# Patient Record
Sex: Male | Born: 1950
Health system: Southern US, Community
[De-identification: ages and names within clinical notes are randomized; demographics above are authoritative.]

## PROBLEM LIST (undated history)

## (undated) DIAGNOSIS — R0602 Shortness of breath: Secondary | ICD-10-CM

## (undated) DIAGNOSIS — I779 Disorder of arteries and arterioles, unspecified: Secondary | ICD-10-CM

## (undated) DIAGNOSIS — M1990 Unspecified osteoarthritis, unspecified site: Secondary | ICD-10-CM

## (undated) DIAGNOSIS — E785 Hyperlipidemia, unspecified: Secondary | ICD-10-CM

## (undated) DIAGNOSIS — Z951 Presence of aortocoronary bypass graft: Secondary | ICD-10-CM

## (undated) DIAGNOSIS — IMO0002 Reserved for concepts with insufficient information to code with codable children: Secondary | ICD-10-CM

## (undated) DIAGNOSIS — R5383 Other fatigue: Secondary | ICD-10-CM

## (undated) DIAGNOSIS — I10 Essential (primary) hypertension: Secondary | ICD-10-CM

## (undated) DIAGNOSIS — I2510 Atherosclerotic heart disease of native coronary artery without angina pectoris: Secondary | ICD-10-CM

## (undated) DIAGNOSIS — R9430 Abnormal result of cardiovascular function study, unspecified: Secondary | ICD-10-CM

## (undated) DIAGNOSIS — I739 Peripheral vascular disease, unspecified: Principal | ICD-10-CM

## (undated) HISTORY — DX: Other fatigue: R53.83

## (undated) HISTORY — PX: WISDOM TOOTH EXTRACTION: SHX21

## (undated) HISTORY — DX: Hyperlipidemia, unspecified: E78.5

## (undated) HISTORY — DX: Peripheral vascular disease, unspecified: I73.9

## (undated) HISTORY — DX: Shortness of breath: R06.02

## (undated) HISTORY — DX: Reserved for concepts with insufficient information to code with codable children: IMO0002

## (undated) HISTORY — DX: Presence of aortocoronary bypass graft: Z95.1

## (undated) HISTORY — DX: Abnormal result of cardiovascular function study, unspecified: R94.30

## (undated) HISTORY — PX: COLONOSCOPY: SHX174

## (undated) HISTORY — DX: Atherosclerotic heart disease of native coronary artery without angina pectoris: I25.10

## (undated) HISTORY — PX: TONSILLECTOMY: SUR1361

## (undated) HISTORY — PX: MOUTH SURGERY: SHX715

## (undated) HISTORY — DX: Disorder of arteries and arterioles, unspecified: I77.9

---

## 2008-05-17 ENCOUNTER — Emergency Department (HOSPITAL_COMMUNITY): Admission: EM | Admit: 2008-05-17 | Discharge: 2008-05-18 | Payer: Self-pay | Admitting: Emergency Medicine

## 2008-05-19 ENCOUNTER — Emergency Department (HOSPITAL_COMMUNITY): Admission: EM | Admit: 2008-05-19 | Discharge: 2008-05-19 | Payer: Self-pay | Admitting: Emergency Medicine

## 2011-09-20 HISTORY — PX: CORONARY ARTERY BYPASS GRAFT: SHX141

## 2011-10-07 ENCOUNTER — Encounter: Payer: Self-pay | Admitting: Cardiology

## 2011-10-08 ENCOUNTER — Institutional Professional Consult (permissible substitution): Payer: Self-pay | Admitting: Cardiology

## 2011-10-11 ENCOUNTER — Encounter: Payer: Self-pay | Admitting: *Deleted

## 2011-10-11 ENCOUNTER — Ambulatory Visit (INDEPENDENT_AMBULATORY_CARE_PROVIDER_SITE_OTHER): Payer: 59 | Admitting: Cardiology

## 2011-10-11 ENCOUNTER — Encounter: Payer: Self-pay | Admitting: Cardiology

## 2011-10-11 DIAGNOSIS — R0602 Shortness of breath: Secondary | ICD-10-CM

## 2011-10-11 DIAGNOSIS — R079 Chest pain, unspecified: Secondary | ICD-10-CM

## 2011-10-11 DIAGNOSIS — R5383 Other fatigue: Secondary | ICD-10-CM | POA: Insufficient documentation

## 2011-10-11 LAB — CBC WITH DIFFERENTIAL/PLATELET
Basophils Relative: 0.6 % (ref 0.0–3.0)
Eosinophils Relative: 1.3 % (ref 0.0–5.0)
HCT: 43.9 % (ref 39.0–52.0)
Hemoglobin: 15.2 g/dL (ref 13.0–17.0)
Lymphs Abs: 1.8 10*3/uL (ref 0.7–4.0)
Monocytes Relative: 8.9 % (ref 3.0–12.0)
Neutro Abs: 3.6 10*3/uL (ref 1.4–7.7)
RDW: 13.9 % (ref 11.5–14.6)
WBC: 6 10*3/uL (ref 4.5–10.5)

## 2011-10-11 LAB — BASIC METABOLIC PANEL
GFR: 64.6 mL/min (ref >60.00)
Glucose, Bld: 114 mg/dL — ABNORMAL HIGH (ref 70–99)
Potassium: 4.1 mEq/L (ref 3.5–5.1)
Sodium: 140 mEq/L (ref 135–145)

## 2011-10-11 NOTE — Assessment & Plan Note (Signed)
The patient has had a very dramatic change in how he is feeling.  He is not able to complete his activities.  He is having exertional chest discomfort.  He has shortness of breath.  He has marked fatigue.  He had an exercise test in 2011 and is reported as showing no marked abnormality.  I carefully considered whether we should proceed with another exercise test.  However after carefully reviewing his overall situation I believe this will not help [place].  If this study is abnormal he will need cardiac catheterization.  If an exercise test or to be normal we will have to consider a false negative as his symptoms have changed so dramatically.  I recommended that we proceed with an outpatient diagnostic catheterization.  The patient and his wife are in agreement.  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  He prefers the groin.  This will be arranged soon.

## 2011-10-11 NOTE — Patient Instructions (Addendum)
Your physician has requested that you have a cardiac catheterization. Cardiac catheterization is used to diagnose and/or treat various heart conditions. Doctors may recommend this procedure for a number of different reasons. The most common reason is to evaluate chest pain. Chest pain can be a symptom of coronary artery disease (CAD), and cardiac catheterization can show whether plaque is narrowing or blocking your heart's arteries. This procedure is also used to evaluate the valves, as well as measure the blood flow and oxygen levels in different parts of your heart. For further information please visit [email or web address] Please follow instruction sheet, as given. Your physician recommends that you return for lab work in: today.

## 2011-10-11 NOTE — Assessment & Plan Note (Signed)
Patient has had chest x-rays by his primary physician.  I do not have all of the reports but there has been no evidence of pneumonia or congestive heart failure.  I have considered a 2-D echo.  I feel we can bypass this at this point and proceed with catheterization and do a left ventriculogram at the time of his catheterization.  We will do a left and right heart catheter be sure that there is no unusual findings in the right heart.

## 2011-10-11 NOTE — Progress Notes (Signed)
HPI The patient is seen today referred for cardiology consultation.  He is very active.  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  He also exercises at the gym and works in his yard.  Over the past month he has noted marked fatigue with exertion.  He does have some chest discomfort and some shortness of breath.  These are always with exertion.  A change for him has been dramatic.  He has not had syncope or presyncope.  He underwent a nuclear exercise test in 2011 elsewhere.  He reports that he was told that this was normal.  That study was done only as a baseline study.  He was not having a marked symptoms and is having now.  There is a family history of coronary disease.  He does not have other marked risk factors are he.  His TSH has been very slightly elevated being followed.  His testosterone is slightly abnormal.  Other labs are normal.   No Known Allergies  Current Outpatient Prescriptions  Medication Sig Dispense Refill  . aspirin 81 MG tablet Take 81 mg by mouth daily.        . metoprolol (TOPROL-XL) 50 MG 24 hr tablet Take 50 mg by mouth daily.          History   Social History  . Marital Status: Married    Spouse Name: N/A    Number of Children: N/A  . Years of Education: N/A   Occupational History  . Not on file.   Social History Main Topics  . Smoking status: Former Games developer  . Smokeless tobacco: Not on file  . Alcohol Use: Not on file  . Drug Use: Not on file  . Sexually Active: Not on file   Other Topics Concern  . Not on file   Social History Narrative  . No narrative on file    Family History  Problem Relation Age of Onset  . Coronary artery disease    . Hypertension      Past Medical History  Diagnosis Date  . Fatigue   . Chest pain     October, 2012  . Shortness of breath     October, 2012    No past surgical history on file.  ROS  Patient denies fever, chills, headache, sweats, rash, change in vision, change in hearing, cough, nausea vomiting,  urinary symptoms.  All other systems are reviewed and are negative. PHYSICAL EXAM Patient is here with his wife.  He is oriented to person time and place.  Affect is normal.  Head is atraumatic.  There is no xanthelasma.  There is no jugular venous distention.  Lungs are clear.  Respiratory effort is nonlabored.  Cardiac exam reveals S1-S2.  No clicks or significant murmurs.  Abdomen is soft.  There is no peripheral edema.  There are no musculoskeletal deformities.  Skin rashes. Filed Vitals:   10/11/11 0930  BP: 151/83  Pulse: 62  Height: 5\' 7"  (1.702 m)  Weight: 177 lb (80.287 kg)    EKG is done today and reviewed by there is mild sinus bradycardia.  There is no acute change.  ASSESSMENT & PLAN

## 2011-10-12 ENCOUNTER — Inpatient Hospital Stay (HOSPITAL_BASED_OUTPATIENT_CLINIC_OR_DEPARTMENT_OTHER)
Admission: RE | Admit: 2011-10-12 | Discharge: 2011-10-12 | Disposition: A | Discharge status: Home / Self Care | Payer: 59 | Source: Ambulatory Visit | Attending: Cardiovascular Disease | Admitting: Cardiovascular Disease

## 2011-10-12 ENCOUNTER — Inpatient Hospital Stay (HOSPITAL_COMMUNITY): Payer: 59

## 2011-10-12 ENCOUNTER — Inpatient Hospital Stay (HOSPITAL_COMMUNITY)
Admission: AD | Admit: 2011-10-12 | Discharge: 2011-10-17 | DRG: 234 | Disposition: A | Discharge status: Home / Self Care | Payer: 59 | Source: Other Acute Inpatient Hospital | Attending: Thoracic Surgery (Cardiothoracic Vascular Surgery) | Admitting: Thoracic Surgery (Cardiothoracic Vascular Surgery)

## 2011-10-12 DIAGNOSIS — I6529 Occlusion and stenosis of unspecified carotid artery: Secondary | ICD-10-CM | POA: Diagnosis present

## 2011-10-12 DIAGNOSIS — I959 Hypotension, unspecified: Secondary | ICD-10-CM | POA: Diagnosis not present

## 2011-10-12 DIAGNOSIS — R079 Chest pain, unspecified: Secondary | ICD-10-CM | POA: Insufficient documentation

## 2011-10-12 DIAGNOSIS — I2510 Atherosclerotic heart disease of native coronary artery without angina pectoris: Principal | ICD-10-CM | POA: Diagnosis present

## 2011-10-12 DIAGNOSIS — D649 Anemia, unspecified: Secondary | ICD-10-CM | POA: Diagnosis not present

## 2011-10-12 DIAGNOSIS — Z01810 Encounter for preprocedural cardiovascular examination: Secondary | ICD-10-CM

## 2011-10-12 DIAGNOSIS — R110 Nausea: Secondary | ICD-10-CM | POA: Diagnosis not present

## 2011-10-12 DIAGNOSIS — I10 Essential (primary) hypertension: Secondary | ICD-10-CM | POA: Diagnosis present

## 2011-10-12 DIAGNOSIS — R0602 Shortness of breath: Secondary | ICD-10-CM | POA: Insufficient documentation

## 2011-10-12 DIAGNOSIS — Z87891 Personal history of nicotine dependence: Secondary | ICD-10-CM

## 2011-10-12 DIAGNOSIS — Z79899 Other long term (current) drug therapy: Secondary | ICD-10-CM

## 2011-10-12 DIAGNOSIS — D696 Thrombocytopenia, unspecified: Secondary | ICD-10-CM | POA: Diagnosis not present

## 2011-10-12 DIAGNOSIS — Z7982 Long term (current) use of aspirin: Secondary | ICD-10-CM

## 2011-10-12 LAB — URINALYSIS, ROUTINE W REFLEX MICROSCOPIC
Bilirubin Urine: NEGATIVE
Nitrite: NEGATIVE
Protein, ur: NEGATIVE mg/dL
Specific Gravity, Urine: 1.01 (ref 1.005–1.030)
Urobilinogen, UA: 0.2 mg/dL (ref 0.0–1.0)

## 2011-10-12 LAB — POCT I-STAT 3, VENOUS BLOOD GAS (G3P V)
Bicarbonate: 25.9 mEq/L — ABNORMAL HIGH (ref 20.0–24.0)
TCO2: 27 mmol/L (ref 0–100)
pCO2, Ven: 38.4 mmHg — ABNORMAL LOW (ref 45.0–50.0)
pH, Ven: 7.436 — ABNORMAL HIGH (ref 7.250–7.300)
pO2, Ven: 35 mmHg (ref 30.0–45.0)

## 2011-10-12 LAB — COMPREHENSIVE METABOLIC PANEL
ALT: 23 U/L (ref 0–53)
CO2: 27 mEq/L (ref 19–32)
Calcium: 9.3 mg/dL (ref 8.4–10.5)
Chloride: 104 mEq/L (ref 96–112)
Creatinine, Ser: 1.13 mg/dL (ref 0.50–1.35)
GFR calc Af Amer: 80 mL/min — ABNORMAL LOW (ref >90)
GFR calc non Af Amer: 69 mL/min — ABNORMAL LOW (ref >90)
Glucose, Bld: 85 mg/dL (ref 70–99)
Sodium: 141 mEq/L (ref 135–145)
Total Bilirubin: 0.9 mg/dL (ref 0.3–1.2)

## 2011-10-12 LAB — PROTIME-INR: INR: 1.03 (ref 0.00–1.49)

## 2011-10-12 LAB — POCT I-STAT 3, ART BLOOD GAS (G3+)
Acid-Base Excess: 1 mmol/L (ref 0.0–2.0)
Bicarbonate: 25.6 mEq/L — ABNORMAL HIGH (ref 20.0–24.0)
O2 Saturation: 97 %
pO2, Arterial: 92 mmHg (ref 80.0–100.0)

## 2011-10-12 LAB — CBC
Platelets: 140 10*3/uL — ABNORMAL LOW (ref 150–400)
RDW: 13.2 % (ref 11.5–15.5)
WBC: 8.3 10*3/uL (ref 4.0–10.5)

## 2011-10-12 LAB — APTT: aPTT: 31 seconds (ref 24–37)

## 2011-10-12 LAB — SURGICAL PCR SCREEN
MRSA, PCR: NEGATIVE
Staphylococcus aureus: NEGATIVE

## 2011-10-13 ENCOUNTER — Encounter (HOSPITAL_COMMUNITY): Payer: 59

## 2011-10-13 ENCOUNTER — Inpatient Hospital Stay (HOSPITAL_COMMUNITY): Payer: 59

## 2011-10-13 DIAGNOSIS — I2510 Atherosclerotic heart disease of native coronary artery without angina pectoris: Secondary | ICD-10-CM

## 2011-10-13 LAB — POCT I-STAT, CHEM 8
Creatinine, Ser: 1.2 mg/dL (ref 0.50–1.35)
Hemoglobin: 10.9 g/dL — ABNORMAL LOW (ref 13.0–17.0)
Potassium: 4.4 mEq/L (ref 3.5–5.1)
Sodium: 139 mEq/L (ref 135–145)

## 2011-10-13 LAB — LIPID PANEL: Cholesterol: 231 mg/dL — ABNORMAL HIGH (ref 0–200)

## 2011-10-13 LAB — TSH: TSH: 6.949 u[IU]/mL — ABNORMAL HIGH (ref 0.350–4.500)

## 2011-10-13 LAB — CREATININE, SERUM
Creatinine, Ser: 1.17 mg/dL (ref 0.50–1.35)
GFR calc Af Amer: 77 mL/min — ABNORMAL LOW (ref >90)
GFR calc non Af Amer: 67 mL/min — ABNORMAL LOW (ref >90)

## 2011-10-13 LAB — BASIC METABOLIC PANEL
BUN: 17 mg/dL (ref 6–23)
Calcium: 9.1 mg/dL (ref 8.4–10.5)
Chloride: 106 mEq/L (ref 96–112)
Creatinine, Ser: 1.14 mg/dL (ref 0.50–1.35)
GFR calc Af Amer: 80 mL/min — ABNORMAL LOW (ref >90)
GFR calc non Af Amer: 69 mL/min — ABNORMAL LOW (ref >90)

## 2011-10-13 LAB — CBC
HCT: 42.8 % (ref 39.0–52.0)
Hemoglobin: 11.3 g/dL — ABNORMAL LOW (ref 13.0–17.0)
Hemoglobin: 12.5 g/dL — ABNORMAL LOW (ref 13.0–17.0)
MCH: 31.9 pg (ref 26.0–34.0)
MCH: 31.7 pg (ref 26.0–34.0)
MCH: 32.3 pg (ref 26.0–34.0)
MCHC: 35.5 g/dL (ref 30.0–36.0)
MCV: 89.9 fL (ref 78.0–100.0)
MCV: 89.3 fL (ref 78.0–100.0)
Platelets: 131 10*3/uL — ABNORMAL LOW (ref 150–400)
RBC: 3.56 MIL/uL — ABNORMAL LOW (ref 4.22–5.81)
RBC: 3.87 MIL/uL — ABNORMAL LOW (ref 4.22–5.81)
RDW: 13.2 % (ref 11.5–15.5)
WBC: 7.5 10*3/uL (ref 4.0–10.5)
WBC: 14.3 10*3/uL — ABNORMAL HIGH (ref 4.0–10.5)

## 2011-10-13 LAB — POCT I-STAT 3, ART BLOOD GAS (G3+)
Bicarbonate: 20.9 mEq/L (ref 20.0–24.0)
O2 Saturation: 100 %
O2 Saturation: 93 %
Patient temperature: 35.7
TCO2: 25 mmol/L (ref 0–100)
TCO2: 22 mmol/L (ref 0–100)
pCO2 arterial: 41.5 mmHg (ref 35.0–45.0)
pCO2 arterial: 36.6 mmHg (ref 35.0–45.0)
pCO2 arterial: 30.2 mmHg — ABNORMAL LOW (ref 35.0–45.0)
pH, Arterial: 7.447 (ref 7.350–7.450)
pO2, Arterial: 286 mmHg — ABNORMAL HIGH (ref 80.0–100.0)

## 2011-10-13 LAB — POCT I-STAT 4, (NA,K, GLUC, HGB,HCT)
Glucose, Bld: 106 mg/dL — ABNORMAL HIGH (ref 70–99)
Glucose, Bld: 87 mg/dL (ref 70–99)
Glucose, Bld: 93 mg/dL (ref 70–99)
HCT: 41 % (ref 39.0–52.0)
HCT: 27 % — ABNORMAL LOW (ref 39.0–52.0)
HCT: 33 % — ABNORMAL LOW (ref 39.0–52.0)
Hemoglobin: 10.2 g/dL — ABNORMAL LOW (ref 13.0–17.0)
Hemoglobin: 13.9 g/dL (ref 13.0–17.0)
Hemoglobin: 9.2 g/dL — ABNORMAL LOW (ref 13.0–17.0)
Potassium: 4.3 mEq/L (ref 3.5–5.1)
Potassium: 3.7 mEq/L (ref 3.5–5.1)
Potassium: 3.9 mEq/L (ref 3.5–5.1)
Sodium: 139 mEq/L (ref 135–145)
Sodium: 136 mEq/L (ref 135–145)
Sodium: 138 mEq/L (ref 135–145)

## 2011-10-13 LAB — HEMOGLOBIN AND HEMATOCRIT, BLOOD
HCT: 28.8 % — ABNORMAL LOW (ref 39.0–52.0)
Hemoglobin: 10.2 g/dL — ABNORMAL LOW (ref 13.0–17.0)

## 2011-10-13 LAB — PROTIME-INR: Prothrombin Time: 18.5 seconds — ABNORMAL HIGH (ref 11.6–15.2)

## 2011-10-13 LAB — MAGNESIUM: Magnesium: 2.7 mg/dL — ABNORMAL HIGH (ref 1.5–2.5)

## 2011-10-13 LAB — PLATELET COUNT: Platelets: 111 10*3/uL — ABNORMAL LOW (ref 150–400)

## 2011-10-13 NOTE — Consult Note (Signed)
NAMEROYE, GUSTAFSON NO.:  [id number]  MEDICAL RECORD NO.:  [id number]  LOCATION:  2917                         FACILITY:  [facility]  PHYSICIAN:  Salvatore Decent. Dorris Fetch, M.D.DATE OF BIRTH:  1951/07/01  DATE OF CONSULTATION:  10/12/2011 DATE OF DISCHARGE:                                CONSULTATION   REASON FOR CONSULTATION:  Left main and three-vessel disease.  HISTORY OF PRESENT ILLNESS:  Mr. Brawn is a 60 year old gentleman with a strong family history of coronary artery disease and hypertension who presented with a chief complaint of exertional fatigue and chest discomfort, also had some degree of shortness of breath.  He had noticed real deterioration since Labor Day with his workouts.  He exercises on a regular basis and he knows that at the gym his workouts are became progressively shorter and more difficult.  He would also notice when doing yard work that he would have to stop frequently because of fatigue and discomfort.  He apparently had had a nuclear stress test in 2011 that was normal but was not having significant symptoms at that time. He was seen in consultation by Dr. Willa Rough and was recommended that he have cardiac catheterization that was done today and he is found to have critical left main and three-vessel coronary artery disease with 95% left main stenosis and 90% ostial and mid lesions in the LAD, 95% ostial circumflex stenosis, and a 99% heavily calcified mid right coronary lesion.  He had normal left ventricular function.  The patient currently is pain-free.  He does note that he has had several episodes usually about 9 or 10 o'clock in the evening where he feels very poorly for about 5 or 10 minutes and then resolve spontaneously.  He has not been awakened from sleep by his symptoms.  PAST MEDICAL HISTORY:  Significant for hypertension.  CURRENT MEDICATIONS:  Aspirin 81 mg daily and Toprol-XL 50 mg daily.  No known drug  allergies.  FAMILY HISTORY:  Significant for father dying at age 59 of MI.  No coronary artery disease in his mother and family history of hypertension and strokes as well.  SOCIAL HISTORY:  Former smoker.  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  REVIEW OF SYSTEMS:  No orthopnea, paroxysmal nocturnal dyspnea, peripheral edema.  No change in bowel or bladder habits.  No history of excessive bleeding or bruising.  He does note that he has been more tired than normal recently and fatigues easily.  All other systems are negative.  PHYSICAL EXAMINATION:  GENERAL:  Mr. Lichty is a well-appearing 60 year old white male, in no acute distress.  He is well developed and well nourished. NEUROLOGIC:  He is alert and oriented x3 with no focal deficits. HEENT:  Unremarkable. NECK:  Without thyromegaly, adenopathy, or bruits. CARDIAC:  Regular rate and rhythm.  Normal S1 and S2.  No rubs, murmurs, or gallops. LUNGS:  Clear breath sounds bilaterally.  EXTREMITIES:  Without clubbing, cyanosis, or edema.  He has 2+ pulses throughout. LABORATORY DATA:  Cardiac catheterization films were reviewed.  His sodium is 140, potassium 4.1, BUN 21, creatinine  1.2, and glucose 114. White count 6, hematocrit 44, and platelets 137.  PT 11.2 and PTT 25.8. EKG showed sinus bradycardia.  IMPRESSION:  Mr. Stthomas is a 60 year old gentleman who presents with progressive angina and catheterization.  He has critical left main and three-vessel coronary artery disease with preserved left ventricular function.  Coronary artery bypass grafting is indicated for survival benefit as well as relief of symptoms.  I discussed in detail with him the indications, risks, benefits, and alternatives.  He understands the general nature of the procedure, need for general anesthesia, expected hospital stay, and overall recovery. He does understand that the risks include, but are not limited to  death, stroke, MI, DVT, PE, bleeding, possible need for transfusions, infection as well as other organ system dysfunction including respiratory, renal, hepatic, or GI complications.  He understands and accepts these risks and agrees to proceed.  We will plan to proceed with coronary artery bypass grafting first case tomorrow morning.  I did speak with the wife by telephone and she is not present here currently.     Salvatore Decent Dorris Fetch, M.D.     SCH/MEDQ  D:  10/12/2011  T:  10/13/2011  Job:  [id number]  cc:   Luis Abed, MD, [facility] Pearline Cables, MD  Electronically Signed by Charlett Lango M.D. on 10/13/2011 04:41:41 PM

## 2011-10-14 ENCOUNTER — Inpatient Hospital Stay (HOSPITAL_COMMUNITY): Payer: 59

## 2011-10-14 LAB — GLUCOSE, CAPILLARY
Glucose-Capillary: 131 mg/dL — ABNORMAL HIGH (ref 70–99)
Glucose-Capillary: 134 mg/dL — ABNORMAL HIGH (ref 70–99)
Glucose-Capillary: 150 mg/dL — ABNORMAL HIGH (ref 70–99)
Glucose-Capillary: 78 mg/dL (ref 70–99)
Glucose-Capillary: 122 mg/dL — ABNORMAL HIGH (ref 70–99)
Glucose-Capillary: 131 mg/dL — ABNORMAL HIGH (ref 70–99)
Glucose-Capillary: 116 mg/dL — ABNORMAL HIGH (ref 70–99)

## 2011-10-14 LAB — BASIC METABOLIC PANEL
Chloride: 105 mEq/L (ref 96–112)
GFR calc Af Amer: >90 mL/min (ref >90)
GFR calc non Af Amer: 79 mL/min — ABNORMAL LOW (ref >90)
Potassium: 4.2 mEq/L (ref 3.5–5.1)
Sodium: 137 mEq/L (ref 135–145)

## 2011-10-14 LAB — POCT I-STAT, CHEM 8
Calcium, Ion: 1.29 mmol/L (ref 1.12–1.32)
Chloride: 101 mEq/L (ref 96–112)
HCT: 30 % — ABNORMAL LOW (ref 39.0–52.0)
Hemoglobin: 10.2 g/dL — ABNORMAL LOW (ref 13.0–17.0)
TCO2: 25 mmol/L (ref 0–100)

## 2011-10-14 LAB — CREATININE, SERUM
Creatinine, Ser: 1.17 mg/dL (ref 0.50–1.35)
GFR calc non Af Amer: 67 mL/min — ABNORMAL LOW (ref >90)

## 2011-10-14 LAB — CBC
HCT: 29.8 % — ABNORMAL LOW (ref 39.0–52.0)
MCH: 31.1 pg (ref 26.0–34.0)
MCHC: 34.6 g/dL (ref 30.0–36.0)
Platelets: 105 10*3/uL — ABNORMAL LOW (ref 150–400)
RDW: 12.9 % (ref 11.5–15.5)
RDW: 13.4 % (ref 11.5–15.5)
WBC: 11.2 10*3/uL — ABNORMAL HIGH (ref 4.0–10.5)

## 2011-10-14 LAB — MAGNESIUM: Magnesium: 1.9 mg/dL (ref 1.5–2.5)

## 2011-10-15 ENCOUNTER — Inpatient Hospital Stay (HOSPITAL_COMMUNITY): Payer: 59

## 2011-10-15 LAB — CBC
MCH: 31.6 pg (ref 26.0–34.0)
MCHC: 34.9 g/dL (ref 30.0–36.0)
MCV: 90.7 fL (ref 78.0–100.0)
Platelets: 84 10*3/uL — ABNORMAL LOW (ref 150–400)
RDW: 13.5 % (ref 11.5–15.5)
WBC: 9.7 10*3/uL (ref 4.0–10.5)

## 2011-10-15 LAB — BASIC METABOLIC PANEL
Calcium: 8.9 mg/dL (ref 8.4–10.5)
Creatinine, Ser: 1.19 mg/dL (ref 0.50–1.35)
GFR calc Af Amer: 76 mL/min — ABNORMAL LOW (ref >90)
GFR calc non Af Amer: 65 mL/min — ABNORMAL LOW (ref >90)

## 2011-10-15 LAB — GLUCOSE, CAPILLARY: Glucose-Capillary: 130 mg/dL — ABNORMAL HIGH (ref 70–99)

## 2011-10-15 NOTE — Op Note (Signed)
NAMEBROCK, LARMON NO.:  [id number]  MEDICAL RECORD NO.:  [id number]  LOCATION:  2316                         FACILITY:  [facility]  PHYSICIAN:  Salvatore Decent. Dorris Fetch, M.D.DATE OF BIRTH:  [birth year]  DATE OF PROCEDURE: DATE OF DISCHARGE:                              OPERATIVE REPORT   PREOPERATIVE DIAGNOSIS:  Critical left main and three-vessel disease with new-onset angina.  POSTOPERATIVE DIAGNOSIS:  Critical left main and three-vessel disease with new-onset angina.  PROCEDURES:  Median sternotomy, extracorporeal circulation, coronary artery bypass grafting x3 (left internal mammary artery to LAD, right internal mammary artery to obtuse marginal 1, and saphenous vein graft to posterior descending).  SURGEON:  Salvatore Decent. Dorris Fetch, MD  ASSISTANT:  Rowe Clack, PA-C  ANESTHESIA:  General.  FINDINGS:  Severe, diffusely diseased coronaries proximally, distal right and proximal posterior descending heavily calcified, all vessels good quality at the site of the anastomosis, good quality conduits.  CLINICAL NOTE:  Mr. Brunty is a 60 year old gentleman with a strong family history of coronary disease.  He presents with about a 2-3-week history of progressive fatigue, exertional chest discomfort, and dyspnea.  He was seen by Dr. Willa Rough and cardiac catheterization was recommended.  There, he was found to have a 95% left main stenosis and a 99% stenosis in the right coronary.  All coronaries were heavily calcified proximally.  The patient was advised to undergo coronary artery bypass grafting.  The indications, risks, benefits, and alternatives were discussed in detail with the patient.  He understood and accepted the risks and agreed to proceed.  He understands there was excellent chance of a good outcome.  He understood the risks included but are not limited to death, stroke, MI, DVT, PE, bleeding, possible need for transfusions, infections as well as  other organ system dysfunction including respiratory failure and renal failure.  OPERATIVE NOTE:  Mr. Chamberlin was brought to the preop holding area on October 13, 2011.  There, the Anesthesia Service placed lines to monitor arterial, central venous, and pulmonary arterial pressures.  Intravenous antibiotics were administered.  He was taken to the operating room, anesthetized, and intubated.  A Foley catheter was placed.  The chest, abdomen, and legs were prepped and draped in usual fashion.  Median sternotomy was performed, and the left internal mammary artery was harvested using standard technique.  Simultaneously, an incision was made in the medial aspect of the right leg and the greater saphenous vein was harvested from the right thigh endoscopically.  2000 units of heparin was administered during the vessel harvest.  After taking down the left internal mammary artery, which was a good quality vessel, the right internal mammary artery was harvested in standard fashion as well. It likewise was a good quality.  Remainder of the full heparin dose was given.  The pericardium was opened after confirming adequate anticoagulation with ACT measurement. The aorta was cannulated via concentric 2-0 Ethibond pledgeted pursestring sutures.  A dual-stage venous cannula was placed via pursestring suture in the right atrial appendage.  Cardiopulmonary bypass was instituted and the patient was cooled to 32 degrees Celsius. The coronary arteries were inspected and anastomotic sites were  chosen. The conduits were inspected and cut to length.  It was noted that because the graft had to be placed out on to the posterior descending, the right mammary was not of adequate length to reach that area as a pedicle graft, however, did reach to the obtuse marginal 1 easily via the transverse sinus.  The further decision was made to use the vein graft to the posterior descending, which was the smaller of the  2 targets.  A foam pad was placed in the pericardium to insulate the heart and protect the left phrenic nerve.  A temperature probe was placed in the myocardial septum and a cardioplegic cannula was placed in the ascending aorta.  The aorta was crossclamped.  The left ventricle was emptied via the aortic root vent.  Cardiac arrest was achieved with a combination of cold antegrade blood cardioplegia and topical iced saline.  750 mL of cardioplegia was administered.  There was rapid diastolic arrest and septal cooling to 10 degrees Celsius.  The following distal anastomoses were performed.  First, a reverse saphenous vein graft was placed end-to-side to the posterior descending branch of the right coronary.  This was a 1.5-mm target.  It was heavily calcified in its proximal portion, but a 1.5-mm probe did pass retrograde back into the right coronary.  The vein was anastomosed end-to-side with a running 7-0 Prolene suture.  All anastomoses were probed proximally and distally at their completion prior to tying the suture.  Cardioplegia was administered down the vein graft at the completion of the anastomosis and there was good flow and good hemostasis.  Next, the right internal mammary artery was brought through the transverse sinus.  Care was taken not to twist the pedicle and the distal end was beveled.  It was then anastomosed end-to-side to obtuse marginal 1, which was the only graftable lateral wall vessel.  It was heavily calcified proximally, but good quality at the site of the anastomosis.  It was a 1.5-mm target vessel.  The mammary to OM1 anastomosis was performed with a running 8-0 Prolene suture.  Again, a probe passed easily proximally and distally at the completion of the anastomosis.  The bulldog clamps briefly removed to inspect for hemostasis and then replaced.  The mammary pedicle was tacked to the epicardial surface of the heart with 6-0 Prolene sutures.  Additional  cardioplegia was administered down the aortic root.  The left internal mammary artery then was brought through a window in the pericardium.  The distal end was beveled.  It was anastomosed end-to- side to the distal LAD.  The LAD had significant calcific plaque throughout its course.  However, 1.5-mm probe did pass for a long distance proximally past the takeoff of the first diagonal and also passed distally to the apex.  The mammary was of good quality vessel was anastomosed end-to-side with a running 8-0 Prolene suture.  At the completion of the mammary to LAD anastomosis, the bulldog clamp was briefly removed to inspect for hemostasis.  Immediate rapid septal rewarming was noted.  The bulldog clamp was replaced.  The mammary pedicle was tacked to the epicardial surface of the heart with 6-0 Prolene sutures.  The vein graft was cut to length.  The cardioplegic cannula was removed from the ascending aorta and the proximal vein graft anastomosis was performed to a 4.5 mm punch aortotomy with a running 6-0 Prolene suture. At the completion of the proximal anastomosis, the patient was placed in Trendelenburg position.  Lidocaine was  administered.  The bulldog clamps removed from the mammary arteries.  The aortic root was de-aired and the aortic crossclamp was removed.  The patient was defibrillated and required single defibrillation with 20 joules and then was in sinus rhythm thereafter.  While rewarming was completed, all proximal and distal anastomoses were inspected for hemostasis.  Epicardial pacing wires were placed on the right ventricle and right atrium.  When the patient had rewarmed to a core temperature of 37 degrees Celsius, he was weaned from cardiopulmonary bypass on the first attempt.  The initial cardiac index was greater than 2 L/min/m2 after weaning from bypass.  The patient remained hemodynamically stable throughout the postbypass period.  Test dose of protamine was  administered and was well tolerated.  The atrial and aortic cannulae were removed.  The remainder of the protamine was administered without incident.  The chest irrigated with warm saline.  The pericardium was reapproximated over the heart with interrupted 3-0 silk sutures.  There was an incision on the right side of the pericardium to allow passage of the right mammary artery through the transverse sinus, which precluded closing the pericardium over the base of the aorta.  Bilateral pleural tubes and a single mediastinal chest tube were placed in a separate subcostal incisions and secured with #1 silk sutures.  The sternum was closed with interrupted heavy gauge stainless steel wires.  The pectoralis fascia, subcutaneous tissue, and skin were closed in standard fashion.  All sponge, needle, and instrument counts were correct at the end of the procedure.  The patient was taken from the operating room to the surgical intensive care unit in good condition.     Salvatore Decent Dorris Fetch, M.D.     SCH/MEDQ  D:  10/13/2011  T:  10/13/2011  Job:  [id number]  cc:   Luis Abed, MD, [facility] Pearline Cables, MD  Electronically Signed by Charlett Lango M.D. on 10/15/2011 11:37:08 AM

## 2011-10-16 ENCOUNTER — Inpatient Hospital Stay (HOSPITAL_COMMUNITY): Payer: 59

## 2011-10-16 DIAGNOSIS — I2510 Atherosclerotic heart disease of native coronary artery without angina pectoris: Secondary | ICD-10-CM

## 2011-10-16 LAB — BASIC METABOLIC PANEL
BUN: 15 mg/dL (ref 6–23)
Chloride: 105 mEq/L (ref 96–112)
GFR calc Af Amer: 76 mL/min — ABNORMAL LOW (ref >90)
Glucose, Bld: 95 mg/dL (ref 70–99)
Potassium: 3.8 mEq/L (ref 3.5–5.1)
Sodium: 142 mEq/L (ref 135–145)

## 2011-10-16 LAB — CBC
HCT: 31.1 % — ABNORMAL LOW (ref 39.0–52.0)
Hemoglobin: 10.5 g/dL — ABNORMAL LOW (ref 13.0–17.0)
WBC: 9 10*3/uL (ref 4.0–10.5)

## 2011-10-17 NOTE — Cardiovascular Report (Signed)
NAMEGILMAR, BUA NO.:  [id number]  MEDICAL RECORD NO.:  [id number]  LOCATION:  2917                         FACILITY:  [facility]  PHYSICIAN:  Veverly Fells. Excell Seltzer, MD  DATE OF BIRTH:  02-Apr-1951  DATE OF PROCEDURE:  10/12/2011 DATE OF DISCHARGE:                           CARDIAC CATHETERIZATION   PROCEDURE: 1. Right heart catheterization. 2. Left heart catheterization. 3. Selective coronary angiography. 4. Left ventricular angiography.  PROCEDURAL INDICATION:  Mr. Mundis is a 60 year old gentleman who has been very active.  He has developed shortness of breath, chest discomfort, and marked exertional fatigue.  He had a nuclear stress test in 2011, that showed no significant abnormality.  Because of his progressive symptoms he was referred for right and left heart catheterization for definitive evaluation.  Risks and indications of the procedure were reviewed with the patient in detail.  Informed consent was obtained.  The right groin was prepped, draped and anesthetized with 1% lidocaine.  Using modified Seldinger technique, a 4-French sheath was placed in the right femoral artery and a 6-French sheath was placed in the right femoral vein.  A multipurpose catheter was used for the right heart catheterization.  Standard Judkins catheters were used for the left heart catheterization and coronary angiography.  All catheter exchanges were performed over guidewire, and the patient tolerated the procedure well, and there were no immediate complications.  PROCEDURAL FINDINGS:  Hemodynamics:  Aortic oxygen saturation 97%. Pulmonary artery saturation 70%.  Cardiac output 4.3 L/minute.  Cardiac index is 2.2 L/min/m2.  Invasive pressures:  Right atrial pressure mean of 5, right ventricular pressure 27/9, pulmonary artery pressure 24/10, with a mean of 16, pulmonary capillary wedge pressure is 11.  Aortic pressure is 126/83, with a mean of 101, left ventricular  pressure is 127/16.  Left ventriculography:  This shows normal LV function.  The ejection fraction is estimated at 65%.  There are no regional wall motion abnormalities appreciated.  CORONARY ANGIOGRAPHY:  The left mainstem:  There is severe calcification of the distal left mainstem.  The distal left main has 95% stenosis involving both the ostia of the LAD and the left circumflex.  LAD:  The LAD has critical ostial stenosis of 95-99%.  The vessel has TIMI-3 flow.  There is an 80% lesion further down in the proximal LAD just before the first septal perforator.  The mid and distal vessel have no high-grade obstructive disease after the first diagonal.  The diagonal branches show a very small first diagonal and a moderate to large second diagonal that has 3 subbranches.  The second diagonal has no significant obstruction.  Left circumflex:  The left circumflex has severe ostial stenosis.  There is a 95% ostial lesion with heavy calcification.  The first and second OM branches both appear small.  The first OM has a 90% stenosis.  The second OM has no significant stenosis but again it is very small.  Right coronary artery:  The right coronary artery has critical mid stenosis.  The entire vessel is heavily calcified.  The proximal RCA has 70% stenosis.  The mid RCA has 95-99% stenosis with associated heavy calcification.  The distal  RCA has diffuse disease.  The PDA and posterolateral branches are small in caliber.  The PDA branch fills both antegrade and retrograde from left-to-right collaterals.  FINAL ASSESSMENT: 1. Severe left mainstem and three-vessel coronary artery disease. 2. Normal left ventricular systolic function 3. Normal intracardiac filling pressures.  RECOMMENDATIONS:  The patient will be admitted to the hospital.  He will undergo evaluation by [facility] for consideration of coronary bypass surgery.  If his groin site remains stable, he will  be started on heparin in about 8 hours.     Veverly Fells. Excell Seltzer, MD     MDC/MEDQ  D:  10/12/2011  T:  10/13/2011  Job:  [id number]  cc:   Luis Abed, MD, [facility] Pearline Cables, MD  Electronically Signed by Tonny Bollman MD on 10/17/2011 10:08:06 PM

## 2011-10-18 LAB — HEPARIN INDUCED THROMBOCYTOPENIA PNL
Patient O.D.: 0.143
UFH High Dose UFH H: 0 % Release
UFH Low Dose 0.1 IU/mL: 0 % Release
UFH SRA Result: NEGATIVE

## 2011-10-19 ENCOUNTER — Telehealth: Payer: Self-pay | Admitting: *Deleted

## 2011-10-19 DIAGNOSIS — R110 Nausea: Secondary | ICD-10-CM

## 2011-10-19 MED ORDER — PROMETHAZINE HCL 12.5 MG PO TABS: 25.0000 mg | ORAL_TABLET | Freq: Four times a day (QID) | ORAL | Status: AC | PRN

## 2011-10-19 NOTE — Telephone Encounter (Signed)
I notified Dr. Dorris Fetch of Scott Marshall nausea, dry heaves and lo grade temps.  I reviewed all systems with Scott Marshall and these were negative as to the cause of his fevers and relayed these to Dr. Dorris Fetch.  He recommended Phenergan 25mg , one tab, q6hrs prn.  But, if Scott Marshall felt terribly bad that he could go to the hospital or we could see him at the office.  This info was relayed to him.  He felt as if he could get the nausea under control and get some sleep he would feel much better.  Reassured.

## 2011-10-20 ENCOUNTER — Encounter: Payer: Self-pay | Admitting: *Deleted

## 2011-10-21 NOTE — Discharge Summary (Signed)
NAMEANTION, Scott NO.:  [id number]  MEDICAL RECORD NO.:  [id number]  LOCATION:  2008                         FACILITY:  [facility]  PHYSICIAN:  Salvatore Decent. Dorris Fetch, M.D.DATE OF BIRTH:  04/02/1951  DATE OF ADMISSION:  10/12/2011 DATE OF DISCHARGE:  10/17/2011                              DISCHARGE SUMMARY   PRIMARY ADMITTING DIAGNOSIS:  Chest pain.  ADDITIONAL/DISCHARGE DIAGNOSES: 1. Severe left main and three-vessel coronary artery disease. 2. Hypertension. 3. Remote history of tobacco abuse. 4. Right 60-79% internal carotid artery stenosis by duplex.  PROCEDURES PERFORMED: 1. Cardiac catheterizations. 2. Coronary artery bypass grafting x3 (left internal mammary artery to     the LAD, right internal mammary artery to the first obtuse     marginal, saphenous vein graft to the posterior descending). 3. Endoscopic vein harvest, right leg.  HISTORY:  The patient is a 60 year old male who presented with a 2-3 week history of exertional fatigue and chest discomfort with associated shortness of breath.  He has noted since the beginning of September that he has had more difficulty with shortness of breath and fatigue with exercise.  He was seen in consultation by Dr. Willa Rough and it was recommended that he undergo cardiac catheterizations.  He was brought in to [facility] on the date of this admission and catheterizations showed critical left main and severe three-vessel coronary artery disease with 95% left main stenosis, a 90% ostial and mid LAD lesion, 95% ostial circumflex stenosis, and a 99% calcified mid right coronary artery lesion.  Left ventricular ejection fraction was within normal limits.  Because of these findings, the patient was subsequently admitted for further workup.  HOSPITAL COURSE:  Scott Marshall was admitted and was seen in consultation by Dr. Charlett Lango for cardiac surgery.  It was felt that because of his severe left  main disease he will be a poor candidate for percutaneous intervention.  Dr. Dorris Fetch reviewed his films and agreed with the need for coronary artery bypass grafting.  He explained all risks, benefits, and alternatives of surgery to the patient and he agreed to proceed.  He remained stable and pain free in the hospital prior to surgery on heparin.  His preoperative Doppler study showed a right 60-79% ICA stenosis, otherwise within normal limits and the ABIs were normal bilaterally.  He was taken to the operating room on October 13, 2011, and underwent CABG x3 as described above.  Please see previously dictated operative report for complete details of surgery. He tolerated the procedure well and was transferred to the SICU in stable condition.  He was extubated shortly after surgery.  He was hemodynamically stable on postop day #1.  He remained in the unit for further observation secondary to some nausea and hypotension.  He also had thrombocytopenia which was monitored closely and treated conservatively.  By postop day #2, he was ready for transfer to the floor.  His postoperative course has continued to progress well.  He is ambulating in the halls without difficulty and he has been weaned from supplemental oxygen.  His nausea has resolved ad he is tolerating a regular diet.  His platelet function  is improving.  He is maintaining normal sinus rhythm.  He has been somewhat volume overloaded, but has been started on Lasix and has been diuresed back down to his preoperative weight.  He has no significant lower extremity edema on physical exam.  His postop day #3 labs show hemoglobin of 10.5, hematocrit 31.1, platelets 102, white count 9.0, sodium 142, potassium 3.2, BUN 15, creatinine 1.19.  Chest x-ray shows a tiny left pleural effusion with some atelectasis bilaterally.  Overall, he is progressing well and has been evaluated on postop day #4, October 17, 2011, and deemed ready for  discharge home.  DISCHARGE MEDICATIONS: 1. Enteric-coated aspirin 325 mg daily. 2. Lasix 40 mg daily x3 days. 3. Dilaudid 2-4 mg q.4 hours p.r.n. pain. 4. Toprol-XL 25 mg daily. 5. Potassium 20 mEq daily x3 days. 6. Crestor 40 mg daily.  DISCHARGE INSTRUCTIONS:  He is asked to refrain from driving, heavy lifting, or strenuous activity.  He may continue ambulating daily and using his incentive spirometer.  He may shower daily and clean his incisions with soap and water.  He will continue a low-fat, low-sodium diet.  DISCHARGE FOLLOWUP:  He will need to make an appointment to see Dr. Myrtis Ser in 2 weeks.  He will then follow up in 3 weeks with Dr. Dorris Fetch with a chest x-ray.  In the interim if he experiences any problems or has questions, he is asked to contact our office immediately.     Coral Ceo, P.A.   ______________________________ Salvatore Decent Dorris Fetch, M.D.    GC/MEDQ  D:  10/17/2011  T:  10/17/2011  Job:  [id number]  cc:   Luis Abed, MD, [facility] Pearline Cables, MD [facility] Office  Electronically Signed by Weldon Inches. on 10/19/2011 10:22:06 AM Electronically Signed by Charlett Lango M.D. on 10/21/2011 11:43:04 AM

## 2011-11-02 ENCOUNTER — Encounter: Payer: Self-pay | Admitting: *Deleted

## 2011-11-09 ENCOUNTER — Encounter: Payer: Self-pay | Admitting: Cardiology

## 2011-11-09 ENCOUNTER — Ambulatory Visit (INDEPENDENT_AMBULATORY_CARE_PROVIDER_SITE_OTHER): Payer: 59 | Admitting: Cardiology

## 2011-11-09 DIAGNOSIS — Z951 Presence of aortocoronary bypass graft: Secondary | ICD-10-CM | POA: Insufficient documentation

## 2011-11-09 DIAGNOSIS — Z9889 Other specified postprocedural states: Secondary | ICD-10-CM

## 2011-11-09 DIAGNOSIS — I2510 Atherosclerotic heart disease of native coronary artery without angina pectoris: Secondary | ICD-10-CM | POA: Insufficient documentation

## 2011-11-09 DIAGNOSIS — R0602 Shortness of breath: Secondary | ICD-10-CM

## 2011-11-09 DIAGNOSIS — I779 Disorder of arteries and arterioles, unspecified: Secondary | ICD-10-CM

## 2011-11-09 NOTE — Assessment & Plan Note (Signed)
At this point he is not having any significant shortness of breath.  This was probably an anginal equivalent.

## 2011-11-09 NOTE — Assessment & Plan Note (Signed)
Patient is recovering nicely from CABG.  I've given him permission to increase his walking.  He still has to be careful concerning his other lifting.  He will be seeing his surgeon next week.  I have encouraged cardiac rehabilitation .

## 2011-11-09 NOTE — Patient Instructions (Signed)
Your physician recommends that you schedule a follow-up appointment in: Dec 03, 2011

## 2011-11-09 NOTE — Assessment & Plan Note (Signed)
The patient pre-CABG Doppler revealed carotid artery disease.  We will follow this carefully over time.

## 2011-11-09 NOTE — Progress Notes (Signed)
HPI   Patient returns today for cardiology followup.  I had seen him on October 11, 2011.  At that time he gave a history of having new onset limitation with his exercise capacity.  This was quite concerning.  We decided to proceed directly to cardiac catheterization.  The catheter showed significant disease.  He then underwent CABG and was discharged home on October 17, 2011.  As part of today's evaluation I have reviewed my prior records and the hospital catheter note and the hospital discharge summary.  I've had a long discussion with the patient and his wife also the other post-CABG issues.  He is feeling well.  He's not having any significant chest pain.  No Known Allergies  Current Outpatient Prescriptions  Medication Sig Dispense Refill  . aspirin 325 MG tablet Take 325 mg by mouth daily.        . CRESTOR 40 MG tablet Take 1 tablet by mouth at bedtime.      . metoprolol succinate (TOPROL-XL) 25 MG 24 hr tablet Take 25 mg by mouth daily.          History   Social History  . Marital Status: Married    Spouse Name: N/A    Number of Children: 0  . Years of Education: N/A   Occupational History  . management @ [employer]    Social History Main Topics  . Smoking status: Former Smoker    Quit date: 12/20/1984  . Smokeless tobacco: Not on file  . Alcohol Use: Yes     2-3 a year maybe  . Drug Use: No  . Sexually Active: Not on file   Other Topics Concern  . Not on file   Social History Narrative  . No narrative on file    Family History  Problem Relation Age of Onset  . Coronary artery disease    . Hypertension    . Stroke Mother   . Thyroid disease Mother   . Dementia Mother   . Heart attack Father     Past Medical History  Diagnosis Date  . Fatigue   . Chest pain     October, 2012  . Shortness of breath     October, 2012    Past Surgical History  Procedure Date  . Coronary artery bypass graft 09/2011  . Tonsillectomy     ROS    Patient denies fever,  chills, headache, sweats, rash, change in vision, change in hearing, chest pain, cough, nausea vomiting, urinary symptoms.  All other systems are reviewed and are negative.  PHYSICAL EXAM   He looks good today.  Is here with his wife.  Head is atraumatic.  There is no xanthelasma.  There is no jugular venous distention.  Lungs are clear.  Respiratory effort is nonlabored.  Cardiac exam reveals an S1 and S2.  There no clicks or significant murmurs.  The abdomen is soft.  There is no peripheral edema.  There no musculoskeletal deformities.  There no skin rashes.  His sternum is nicely healed.  Filed Vitals:   11/09/11 1115  BP: 106/66  Pulse: 72  Height: 5\' 8"  (1.727 m)  Weight: 167 lb (75.751 kg)    EKG  EKG is done today and reviewed by me.  He is in sinus rhythm.  There are no significant ST changes post CABG.  ASSESSMENT & PLAN

## 2011-11-15 ENCOUNTER — Other Ambulatory Visit: Payer: Self-pay | Admitting: Thoracic Surgery (Cardiothoracic Vascular Surgery)

## 2011-11-15 DIAGNOSIS — I2510 Atherosclerotic heart disease of native coronary artery without angina pectoris: Secondary | ICD-10-CM

## 2011-11-17 ENCOUNTER — Encounter: Payer: Self-pay | Admitting: Thoracic Surgery (Cardiothoracic Vascular Surgery)

## 2011-11-17 ENCOUNTER — Ambulatory Visit (INDEPENDENT_AMBULATORY_CARE_PROVIDER_SITE_OTHER): Payer: Self-pay | Admitting: Thoracic Surgery (Cardiothoracic Vascular Surgery)

## 2011-11-17 ENCOUNTER — Ambulatory Visit
Admission: RE | Admit: 2011-11-17 | Discharge: 2011-11-17 | Disposition: A | Discharge status: Home / Self Care | Payer: 59 | Source: Ambulatory Visit | Attending: Thoracic Surgery (Cardiothoracic Vascular Surgery) | Admitting: Thoracic Surgery (Cardiothoracic Vascular Surgery)

## 2011-11-17 VITALS — BP 120/70 | HR 66 | Resp 16 | Ht 68.0 in | Wt 163.0 lb

## 2011-11-17 DIAGNOSIS — I2510 Atherosclerotic heart disease of native coronary artery without angina pectoris: Secondary | ICD-10-CM

## 2011-11-17 NOTE — Progress Notes (Signed)
Scott Marshall is a 60 year old gentleman who underwent coronary bypass grafting x3 with bilateral mammaries on October 24 after presenting with new onset angina he been found to have left main and three-vessel disease. His postoperative course was uncomplicated.  Since discharge she says he's been doing well he had nausea for a few days but that resolved. In since that time he has no complaints. He has not taken any pain medication since discharge from the hospital. He says his walking up to an hour twice a day but a very slow pace. He's not having anginal type pain. He is anxious to return to work in full activities.  Past Medical History  Diagnosis Date  . Fatigue   . Carotid artery disease     Doppler, October, 2012, 60-79% R. ICA  . Shortness of breath     October, 2012, This was an anginal equivalent  . CAD (coronary artery disease)     October, 2012,, catheterization done, CABG  . Hx of CABG     October, 2012, Dr. Dorris Fetch.    Current Outpatient Prescriptions on File Prior to Visit  Medication Sig Dispense Refill  . aspirin 325 MG tablet Take 325 mg by mouth daily.        . CRESTOR 40 MG tablet Take 1 tablet by mouth at bedtime.      . metoprolol succinate (TOPROL-XL) 25 MG 24 hr tablet Take 25 mg by mouth daily.          BP 120/70  Pulse 66  Resp 16  Ht 5\' 8"  (1.727 m)  Wt 163 lb (73.936 kg)  BMI 24.78 kg/m2  SpO2 98% In general he is well-developed well-nourished in no acute distress Neuro intact Cardiac regular rate and rhythm, no murmur Lungs clear with equal breath sounds bilaterally Sternum stable Sternal and leg incisions clean dry and intact No edema.  Chest x-ray showed good aeration the lungs bilaterally.  Impression Scott Marshall is doing well at this point in time. He is now about 5 weeks out from surgery. Is having minimal discomfort and not requiring narcotics. I encouraged him at this point to start increasing the intensity of his workouts gradually. He may  begin driving, appropriate precautions were discussed. He still not to lift any objects greater than 10 pounds for another week, and nothing over 20 pounds for another 3 weeks.  He wishes to return to work on December 17, that should not be a problem.  Dr. Myrtis Ser continue to follow for his cardiac care, I would be happy to see him back any time if I can be of any further assistance.

## 2011-11-29 ENCOUNTER — Ambulatory Visit (HOSPITAL_COMMUNITY): Payer: 59

## 2011-11-29 ENCOUNTER — Encounter (HOSPITAL_COMMUNITY)
Admission: RE | Admit: 2011-11-29 | Discharge: 2011-11-29 | Disposition: A | Discharge status: Home / Self Care | Payer: 59 | Source: Ambulatory Visit | Attending: Cardiology | Admitting: Cardiology

## 2011-11-29 DIAGNOSIS — Z5189 Encounter for other specified aftercare: Secondary | ICD-10-CM | POA: Insufficient documentation

## 2011-11-29 DIAGNOSIS — Z79899 Other long term (current) drug therapy: Secondary | ICD-10-CM | POA: Insufficient documentation

## 2011-11-29 DIAGNOSIS — I6529 Occlusion and stenosis of unspecified carotid artery: Secondary | ICD-10-CM | POA: Insufficient documentation

## 2011-11-29 DIAGNOSIS — Z7982 Long term (current) use of aspirin: Secondary | ICD-10-CM | POA: Insufficient documentation

## 2011-11-29 DIAGNOSIS — I2510 Atherosclerotic heart disease of native coronary artery without angina pectoris: Secondary | ICD-10-CM | POA: Insufficient documentation

## 2011-11-29 DIAGNOSIS — Z951 Presence of aortocoronary bypass graft: Secondary | ICD-10-CM | POA: Insufficient documentation

## 2011-11-29 DIAGNOSIS — Z87891 Personal history of nicotine dependence: Secondary | ICD-10-CM | POA: Insufficient documentation

## 2011-11-29 DIAGNOSIS — I10 Essential (primary) hypertension: Secondary | ICD-10-CM | POA: Insufficient documentation

## 2011-11-29 NOTE — Progress Notes (Signed)
Pt started cardiac rehab today.  Pt tolerated light exercise without difficulty.  Pt plans to participate in cardiac rehab until the end of the year due to pt's insurance benefit coverage. Monitor showed sr with no ectopy.  Continue to monitor.

## 2011-11-29 NOTE — Progress Notes (Signed)
Scott Marshall 60 y.o. male       Nutrition Screen                                                                    YES  NO Do you live in a nursing home?  X   Do you eat out more than 3 times/week?    X If yes, how many times per week do you eat out?  Do you have food allergies?   X If yes, what are you allergic to?  Have you gained or lost more than 10 lbs without trying?               X If yes, how much weight have you lost and over what time period?  lbs gained or lost over  weeks/month  Do you want to lose weight?    X  If yes, what is a goal weight or amount of weight you would like to lose?  Do you eat alone most of the time?   X   Do you eat less than 2 meals/day?  X If yes, how many meals do you eat?  Do you drink more than 3 alcohol drinks/day?  X If yes, how many drinks per day?  Are you having trouble with constipation? *  X If yes, what are you doing to help relieve constipation?  Do you have financial difficulties with buying food?*    X   Are you experiencing regular nausea/ vomiting?*     X   Do you have a poor appetite? *                                        X   Do you have trouble chewing/swallowing? *   X    Pt with diagnoses of:  X CABG              X Dyslipidemia  / HDL< 40 / LDL>70 / High TG      X %  Body fat >goal / Body Mass Index >25 X HTN / BP >120/80       Pt Risk Score    01       Diagnosis Risk Score  20       Total Risk Score   21                         High Risk               X Low Risk              HT: 66.5" Ht Readings from Last 1 Encounters:  11/17/11 5\' 8"  (1.727 m)    WT: 148.7 lb (67.6 kg) Wt Readings from Last 3 Encounters:  11/17/11 163 lb (73.936 kg)  11/09/11 167 lb (75.751 kg)  10/11/11 177 lb (80.287 kg)     IBW 65.9 103%IBW BMI 23.7 26.2%body fat  Meds: reviewed Past Medical History  Diagnosis Date  . Fatigue   . Carotid artery disease     Doppler, October, 2012, 60-79% R. ICA  . Shortness of  breath     October, 2012,  This was an anginal equivalent  . CAD (coronary artery disease)     October, 2012,, catheterization done, CABG  . Hx of CABG     October, 2012, Dr. Dorris Fetch.         Activity level: Pt is active  Wt goal: 148.7 lb ( 67.6 kg) Current tobacco use? No       Food/Drug Interaction? No  Labs:  Lipid Panel     Component Value Date/Time   CHOL 231* 10/13/2011 0520   TRIG 154* 10/13/2011 0520   HDL 37* 10/13/2011 0520   CHOLHDL 6.2 10/13/2011 0520   VLDL 31 10/13/2011 0520   LDLCALC 163* 10/13/2011 0520   10/15/11 Glucose 95   LDL goal:    < 70     MI, DM, Carotid or PVD and > 2:      tobacco, HTN, HDL, family h/o, lipoprotein a     > 59 yo male or        >59 yo male   Estimated Daily Nutrition Needs for: ? wt maintenance  2200-2450 Kcal , Total Fat 70-80gm, Saturated Fat 16-19 gm, Trans Fat 2.4-2.7 gm,  Sodium less than 1500 mg

## 2011-12-01 ENCOUNTER — Ambulatory Visit (HOSPITAL_COMMUNITY): Payer: 59

## 2011-12-01 ENCOUNTER — Encounter (HOSPITAL_COMMUNITY)
Admission: RE | Admit: 2011-12-01 | Discharge: 2011-12-01 | Disposition: A | Discharge status: Home / Self Care | Payer: 59 | Source: Ambulatory Visit | Attending: Cardiology | Admitting: Cardiology

## 2011-12-03 ENCOUNTER — Encounter (HOSPITAL_COMMUNITY)
Admission: RE | Admit: 2011-12-03 | Discharge: 2011-12-03 | Disposition: A | Discharge status: Home / Self Care | Payer: 59 | Source: Ambulatory Visit | Attending: Cardiology | Admitting: Cardiology

## 2011-12-03 ENCOUNTER — Ambulatory Visit (HOSPITAL_COMMUNITY): Payer: 59

## 2011-12-03 ENCOUNTER — Ambulatory Visit: Payer: 59 | Admitting: Cardiology

## 2011-12-03 NOTE — Progress Notes (Signed)
Reviewed home exercise with pt today.  Pt plans to walk, use stair-master, and ride recumbent bike for exercise.  Reviewed THR, pulse, RPE, sign and symptoms, and when to call 911 or MD.  Pt voiced understanding.

## 2011-12-06 ENCOUNTER — Encounter (HOSPITAL_COMMUNITY)
Admission: RE | Admit: 2011-12-06 | Discharge: 2011-12-06 | Disposition: A | Discharge status: Home / Self Care | Payer: 59 | Source: Ambulatory Visit | Attending: Cardiology | Admitting: Cardiology

## 2011-12-06 ENCOUNTER — Ambulatory Visit (HOSPITAL_COMMUNITY): Payer: 59

## 2011-12-07 ENCOUNTER — Encounter: Payer: Self-pay | Admitting: Cardiology

## 2011-12-07 ENCOUNTER — Ambulatory Visit (INDEPENDENT_AMBULATORY_CARE_PROVIDER_SITE_OTHER): Payer: 59 | Admitting: Cardiology

## 2011-12-07 DIAGNOSIS — R0989 Other specified symptoms and signs involving the circulatory and respiratory systems: Secondary | ICD-10-CM

## 2011-12-07 DIAGNOSIS — Z9889 Other specified postprocedural states: Secondary | ICD-10-CM

## 2011-12-07 DIAGNOSIS — Z951 Presence of aortocoronary bypass graft: Secondary | ICD-10-CM

## 2011-12-07 DIAGNOSIS — R9430 Abnormal result of cardiovascular function study, unspecified: Secondary | ICD-10-CM

## 2011-12-07 DIAGNOSIS — R0602 Shortness of breath: Secondary | ICD-10-CM

## 2011-12-07 MED ORDER — ROSUVASTATIN CALCIUM 40 MG PO TABS: 40.0000 mg | ORAL_TABLET | Freq: Every day | ORAL | Status: DC

## 2011-12-07 MED ORDER — METOPROLOL SUCCINATE ER 25 MG PO TB24: 25.0000 mg | ORAL_TABLET | Freq: Every day | ORAL | Status: DC

## 2011-12-07 NOTE — Patient Instructions (Signed)
Your physician recommends that you schedule a follow-up appointment in: 3 months.  

## 2011-12-07 NOTE — Assessment & Plan Note (Signed)
His shortness of breath was definitely his anginal equivalent. As he is walking on the treadmill at this point his breathing is greatly improved after his CABG. We'll see him back in followup in 3 months.

## 2011-12-07 NOTE — Assessment & Plan Note (Signed)
The patient's ejection fraction is normal. He did not have a documented acute coronary syndrome. He is on a low dose of beta blocker at this point. I will consider stopping this in the future.

## 2011-12-07 NOTE — Progress Notes (Signed)
HPI    The patient returns for followup after his bypass surgery. I had seen him early post bypass. He has been released by Dr.Hendrickson. He has been to cardiac rehabilitation on a few occasions. He is very motivated and the rehabilitation program will be closed. He will not do any further significant rehabilitation. He has already returned to his gym. I thought the him about the exercises that he can and cannot do.  No Known Allergies  Current Outpatient Prescriptions  Medication Sig Dispense Refill  . aspirin 325 MG tablet Take 325 mg by mouth daily.        . CRESTOR 40 MG tablet Take 1 tablet by mouth at bedtime.      . metoprolol succinate (TOPROL-XL) 25 MG 24 hr tablet Take 25 mg by mouth daily.          History   Social History  . Marital Status: Married    Spouse Name: N/A    Number of Children: 0  . Years of Education: N/A   Occupational History  . management @ [employer]    Social History Main Topics  . Smoking status: Former Smoker    Quit date: 12/20/1984  . Smokeless tobacco: Not on file  . Alcohol Use: Yes     2-3 a year maybe  . Drug Use: No  . Sexually Active: Not on file   Other Topics Concern  . Not on file   Social History Narrative  . No narrative on file    Family History  Problem Relation Age of Onset  . Coronary artery disease    . Hypertension    . Stroke Mother   . Thyroid disease Mother   . Dementia Mother   . Heart attack Father     Past Medical History  Diagnosis Date  . Fatigue   . Carotid artery disease     Doppler, October, 2012, 60-79% R. ICA  . Shortness of breath     October, 2012, This was an anginal equivalent  . CAD (coronary artery disease)     October, 2012,, catheterization done, CABG  . Hx of CABG     October, 2012, Dr. Dorris Fetch.     Past Surgical History  Procedure Date  . Coronary artery bypass graft 09/2011  . Tonsillectomy     ROS    Patient denies fever, chills, headache, sweats, rash, change in  vision, change in hearing, chest pain, cough, nausea vomiting, urinary symptoms. All other systems are reviewed and are negative.  PHYSICAL EXAM   The patient looks quite good. He is oriented to person time and place. Affect is normal. Head is atraumatic. There is no jugulovenous distention. Lungs are clear. Respiratory effort is nonlabored. Cardiac exam reveals S1 and S2. There are no clicks or significant murmurs. The abdomen is soft. There is no peripheral edema. His median sternotomy site is nicely healed.  Filed Vitals:   12/07/11 1008  BP: 112/70  Pulse: 82  Height: 5\' 8"  (1.727 m)  Weight: 169 lb 12.8 oz (77.021 kg)    EKG  ASSESSMENT & PLAN

## 2011-12-07 NOTE — Assessment & Plan Note (Signed)
He is recovering very nicely from his CABG. We talked at length about the specifics of his return to activities. He will not do heavy lifting or play golf for an additional 3 months. 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

## 2011-12-08 ENCOUNTER — Ambulatory Visit (HOSPITAL_COMMUNITY): Payer: 59

## 2011-12-08 ENCOUNTER — Encounter (HOSPITAL_COMMUNITY)
Admission: RE | Admit: 2011-12-08 | Discharge: 2011-12-08 | Disposition: A | Discharge status: Home / Self Care | Payer: 59 | Source: Ambulatory Visit | Attending: Cardiology | Admitting: Cardiology

## 2011-12-10 ENCOUNTER — Encounter (HOSPITAL_COMMUNITY)
Admission: RE | Admit: 2011-12-10 | Discharge: 2011-12-10 | Disposition: A | Discharge status: Home / Self Care | Payer: 59 | Source: Ambulatory Visit | Attending: Cardiology | Admitting: Cardiology

## 2011-12-10 ENCOUNTER — Ambulatory Visit (HOSPITAL_COMMUNITY): Payer: 59

## 2011-12-12 ENCOUNTER — Other Ambulatory Visit: Payer: Self-pay | Admitting: Thoracic Surgery (Cardiothoracic Vascular Surgery)

## 2011-12-22 NOTE — Progress Notes (Signed)
Cardiac Rehabilitation Program Progress Report   Orientation:  11/25/11  Graduate Date:  12/10/2011 Discharge Date:   # of sessions completed: 6  Cardiologist: Adline Potter MD:  Chilton Si  Class Time:  0645  A.  Exercise Program:  Tolerates exercise @ 3.2 METS for 30 minutes  B.  Mental Health:  Good mental attitude  C.  Education/Instruction/Skills  Accurately checks own pulse., Knows THR for exercise, Uses Perceived Exertion Scale and/or Dyspnea Scale and Attended 0 education classes   D.  Nutrition/Weight Control/Body Composition:  Pt did not have changes in body weight or body composition due to short time participating in program.  *This section completed by Mickle Plumb, Andres Shad, RD, LDN, CDE  E.  Blood Lipids    Lab Results  Component Value Date   CHOL 231* 10/13/2011     Lab Results  Component Value Date   TRIG 154* 10/13/2011     Lab Results  Component Value Date   HDL 37* 10/13/2011     Lab Results  Component Value Date   CHOLHDL 6.2 10/13/2011     No results found for this basename: LDLDIRECT      F.  Lifestyle Changes:  Making positive lifestyle changes  G.  Symptoms noted with exercise:  Asymptomatic  Report Completed By:  Dayton Martes   Comments:  Pt attended only six sessions due to insurance but did make good progress while enrolled in program.

## 2011-12-24 ENCOUNTER — Encounter (HOSPITAL_COMMUNITY): Payer: 59

## 2011-12-24 ENCOUNTER — Ambulatory Visit (HOSPITAL_COMMUNITY): Payer: 59

## 2011-12-27 ENCOUNTER — Ambulatory Visit (HOSPITAL_COMMUNITY): Payer: 59

## 2011-12-27 ENCOUNTER — Encounter (HOSPITAL_COMMUNITY): Payer: 59

## 2011-12-27 NOTE — Progress Notes (Addendum)
Cardiac Rehabilitation Program Progress Report   Orientation:  11/25/11  Graduate Date:  12/10/2011 Discharge Date:   # of sessions completed: 6  Cardiologist: Adline Potter MD:  Chilton Si  Class Time:  0645  A.  Exercise Program:  Tolerates exercise @ 3.2 METS for 30 minutes  B.  Mental Health:  Good mental attitude  C.  Education/Instruction/Skills  Accurately checks own pulse., Knows THR for exercise, Uses Perceived Exertion Scale and/or Dyspnea Scale and Attended 0 education classes   D.  Nutrition/Weight Control/Body Composition:  Pt is following a step II heart healthy diet.  Pt did not have changes in body weight or body composition due to short time participating in program.  *This section completed by Mickle Plumb, Andres Shad, RD, LDN, CDE  E.  Blood Lipids Lab Results  Component Value Date   CHOL 231* 10/13/2011   HDL 37* 10/13/2011   LDLCALC 163* 10/13/2011   TRIG 154* 10/13/2011   CHOLHDL 6.2 10/13/2011   F.  Lifestyle Changes:  Making positive lifestyle changes  G.  Symptoms noted with exercise:  Asymptomatic  Report Completed By:  Jacques Earthly   Comments:  Pt attended only six sessions due to insurance but did make good progress while enrolled in program.  Agree with the above assessment.  Thank you for the referral of this patient. Karlene Lineman RN

## 2011-12-29 ENCOUNTER — Encounter (HOSPITAL_COMMUNITY): Payer: 59

## 2011-12-29 ENCOUNTER — Ambulatory Visit (HOSPITAL_COMMUNITY): Payer: 59

## 2011-12-31 ENCOUNTER — Ambulatory Visit (HOSPITAL_COMMUNITY): Payer: 59

## 2011-12-31 ENCOUNTER — Encounter (HOSPITAL_COMMUNITY): Payer: 59

## 2012-01-03 ENCOUNTER — Encounter (HOSPITAL_COMMUNITY): Payer: 59

## 2012-01-03 ENCOUNTER — Ambulatory Visit (HOSPITAL_COMMUNITY): Payer: 59

## 2012-01-05 ENCOUNTER — Encounter (HOSPITAL_COMMUNITY): Payer: 59

## 2012-01-05 ENCOUNTER — Ambulatory Visit (HOSPITAL_COMMUNITY): Payer: 59

## 2012-01-07 ENCOUNTER — Ambulatory Visit (HOSPITAL_COMMUNITY): Payer: 59

## 2012-01-07 ENCOUNTER — Encounter (HOSPITAL_COMMUNITY): Payer: 59

## 2012-01-10 ENCOUNTER — Encounter (HOSPITAL_COMMUNITY): Payer: 59

## 2012-01-10 ENCOUNTER — Ambulatory Visit (HOSPITAL_COMMUNITY): Payer: 59

## 2012-01-12 ENCOUNTER — Ambulatory Visit (HOSPITAL_COMMUNITY): Payer: 59

## 2012-01-12 ENCOUNTER — Encounter (HOSPITAL_COMMUNITY): Payer: 59

## 2012-01-14 ENCOUNTER — Ambulatory Visit (HOSPITAL_COMMUNITY): Payer: 59

## 2012-01-14 ENCOUNTER — Encounter (HOSPITAL_COMMUNITY): Payer: 59

## 2012-01-17 ENCOUNTER — Ambulatory Visit (HOSPITAL_COMMUNITY): Payer: 59

## 2012-01-17 ENCOUNTER — Encounter (HOSPITAL_COMMUNITY): Payer: 59

## 2012-01-19 ENCOUNTER — Encounter (HOSPITAL_COMMUNITY): Payer: 59

## 2012-01-19 ENCOUNTER — Ambulatory Visit (HOSPITAL_COMMUNITY): Payer: 59

## 2012-01-21 ENCOUNTER — Encounter (HOSPITAL_COMMUNITY): Payer: 59

## 2012-01-21 ENCOUNTER — Ambulatory Visit (HOSPITAL_COMMUNITY): Payer: 59

## 2012-01-24 ENCOUNTER — Ambulatory Visit (HOSPITAL_COMMUNITY): Payer: 59

## 2012-01-24 ENCOUNTER — Encounter (HOSPITAL_COMMUNITY): Payer: 59

## 2012-01-26 ENCOUNTER — Ambulatory Visit (HOSPITAL_COMMUNITY): Payer: 59

## 2012-01-26 ENCOUNTER — Encounter (HOSPITAL_COMMUNITY): Payer: 59

## 2012-01-28 ENCOUNTER — Encounter (HOSPITAL_COMMUNITY): Payer: 59

## 2012-01-28 ENCOUNTER — Ambulatory Visit (HOSPITAL_COMMUNITY): Payer: 59

## 2012-01-31 ENCOUNTER — Ambulatory Visit (HOSPITAL_COMMUNITY): Payer: 59

## 2012-01-31 ENCOUNTER — Encounter (HOSPITAL_COMMUNITY): Payer: 59

## 2012-02-02 ENCOUNTER — Ambulatory Visit (HOSPITAL_COMMUNITY): Payer: 59

## 2012-02-02 ENCOUNTER — Encounter (HOSPITAL_COMMUNITY): Payer: 59

## 2012-02-04 ENCOUNTER — Ambulatory Visit (HOSPITAL_COMMUNITY): Payer: 59

## 2012-02-04 ENCOUNTER — Encounter (HOSPITAL_COMMUNITY): Payer: 59

## 2012-02-07 ENCOUNTER — Ambulatory Visit (HOSPITAL_COMMUNITY): Payer: 59

## 2012-02-07 ENCOUNTER — Encounter (HOSPITAL_COMMUNITY): Payer: 59

## 2012-02-09 ENCOUNTER — Encounter (HOSPITAL_COMMUNITY): Payer: 59

## 2012-02-09 ENCOUNTER — Ambulatory Visit (HOSPITAL_COMMUNITY): Payer: 59

## 2012-02-11 ENCOUNTER — Ambulatory Visit (HOSPITAL_COMMUNITY): Payer: 59

## 2012-02-11 ENCOUNTER — Encounter (HOSPITAL_COMMUNITY): Payer: 59

## 2012-02-14 ENCOUNTER — Encounter (HOSPITAL_COMMUNITY): Payer: 59

## 2012-02-14 ENCOUNTER — Ambulatory Visit (HOSPITAL_COMMUNITY): Payer: 59

## 2012-02-16 ENCOUNTER — Ambulatory Visit (HOSPITAL_COMMUNITY): Payer: 59

## 2012-02-16 ENCOUNTER — Encounter (HOSPITAL_COMMUNITY): Payer: 59

## 2012-02-18 ENCOUNTER — Ambulatory Visit (HOSPITAL_COMMUNITY): Payer: 59

## 2012-02-18 ENCOUNTER — Encounter (HOSPITAL_COMMUNITY): Payer: 59

## 2012-02-21 ENCOUNTER — Encounter (HOSPITAL_COMMUNITY): Payer: 59

## 2012-02-21 ENCOUNTER — Ambulatory Visit (HOSPITAL_COMMUNITY): Payer: 59

## 2012-02-23 ENCOUNTER — Ambulatory Visit (HOSPITAL_COMMUNITY): Payer: 59

## 2012-02-23 ENCOUNTER — Encounter (HOSPITAL_COMMUNITY): Payer: 59

## 2012-02-25 ENCOUNTER — Ambulatory Visit (HOSPITAL_COMMUNITY): Payer: 59

## 2012-02-25 ENCOUNTER — Encounter (HOSPITAL_COMMUNITY): Payer: 59

## 2012-02-28 ENCOUNTER — Encounter (HOSPITAL_COMMUNITY): Payer: 59

## 2012-02-28 ENCOUNTER — Ambulatory Visit (HOSPITAL_COMMUNITY): Payer: 59

## 2012-03-01 ENCOUNTER — Encounter (HOSPITAL_COMMUNITY): Payer: 59

## 2012-03-01 ENCOUNTER — Ambulatory Visit (HOSPITAL_COMMUNITY): Payer: 59

## 2012-03-03 ENCOUNTER — Encounter (HOSPITAL_COMMUNITY): Payer: 59

## 2012-03-03 ENCOUNTER — Ambulatory Visit (HOSPITAL_COMMUNITY): Payer: 59

## 2012-03-07 ENCOUNTER — Ambulatory Visit: Payer: 59 | Admitting: Cardiology

## 2012-04-18 ENCOUNTER — Encounter: Payer: Self-pay | Admitting: Cardiology

## 2012-04-18 ENCOUNTER — Ambulatory Visit (INDEPENDENT_AMBULATORY_CARE_PROVIDER_SITE_OTHER): Payer: 59 | Admitting: Cardiology

## 2012-04-18 VITALS — BP 110/62 | HR 65 | Ht 67.0 in | Wt 166.0 lb

## 2012-04-18 DIAGNOSIS — I2510 Atherosclerotic heart disease of native coronary artery without angina pectoris: Secondary | ICD-10-CM

## 2012-04-18 DIAGNOSIS — I779 Disorder of arteries and arterioles, unspecified: Secondary | ICD-10-CM

## 2012-04-18 DIAGNOSIS — R0602 Shortness of breath: Secondary | ICD-10-CM

## 2012-04-18 NOTE — Assessment & Plan Note (Signed)
Shortness of breath was his symptom of ischemia. He is exercising well and playing his drums with no shortness of breath. No further workup.

## 2012-04-18 NOTE — Assessment & Plan Note (Signed)
Patient is doing very well post CABG. He has good LV function. He did not have a myocardial infarction. There is no absolute indication for the continuation of beta blocker and it will be stopped.

## 2012-04-18 NOTE — Patient Instructions (Signed)
Your physician wants you to follow-up in: 6 months.  You will receive a reminder letter in the mail two months in advance. If you don't receive a letter, please call our office to schedule the follow-up appointment.  Your physician recommends that you return for lab work when able: fasting lipid and liver  Your physician has recommended you make the following change in your medication: Stop your Metoprolol  Your physician has requested that you have a carotid duplex. This test is an ultrasound of the carotid arteries in your neck. It looks at blood flow through these arteries that supply the brain with blood. Allow one hour for this exam. There are no restrictions or special instructions.

## 2012-04-18 NOTE — Assessment & Plan Note (Signed)
The patient had a Doppler prior to his CABG with 60-79% R. ICA. This was his first Doppler and therefore he needs a 6 month followup to get a new baseline. This will be done. We'll be in touch with him with the result.

## 2012-04-18 NOTE — Progress Notes (Signed)
   HPI Patient is seen today for followup of coronary artery disease and carotid disease. He is doing very well. Initially he was seen presenting with feeling short of breath while working out at the gym. He was cathed and kept in the hospital for bypass surgery. He's done very well. It is of note that his screening Doppler revealed 60-79% right internal carotid artery stenosis. This was the first Doppler and it was done in October, 2012.  He's recovered very nice from his CABG. He has no chest pain shortness of breath.   No Known Allergies  Current Outpatient Prescriptions  Medication Sig Dispense Refill  . aspirin 325 MG tablet Take 325 mg by mouth daily.        . metoprolol succinate (TOPROL-XL) 25 MG 24 hr tablet Take 1 tablet (25 mg total) by mouth daily.  30 tablet  6  . rosuvastatin (CRESTOR) 40 MG tablet Take 1 tablet (40 mg total) by mouth at bedtime.  30 tablet  6    History   Social History  . Marital Status: Married    Spouse Name: N/A    Number of Children: 0  . Years of Education: N/A   Occupational History  . management @ [employer]    Social History Main Topics  . Smoking status: Former Smoker    Quit date: 12/20/1984  . Smokeless tobacco: Not on file  . Alcohol Use: Yes     2-3 a year maybe  . Drug Use: No  . Sexually Active: Not on file   Other Topics Concern  . Not on file   Social History Narrative  . No narrative on file    Family History  Problem Relation Age of Onset  . Coronary artery disease    . Hypertension    . Stroke Mother   . Thyroid disease Mother   . Dementia Mother   . Heart attack Father     Past Medical History  Diagnosis Date  . Fatigue   . Carotid artery disease     Doppler, October, 2012, 60-79% R. ICA  . Shortness of breath     October, 2012, This was an anginal equivalent  . CAD (coronary artery disease)     October, 2012,, catheterization done, CABG  . Hx of CABG     October, 2012, Dr. Dorris Fetch.   . Ejection  fraction      EF 65%, catheterization, October, 2012    Past Surgical History  Procedure Date  . Coronary artery bypass graft 09/2011  . Tonsillectomy     ROS  Patient denies fever, chills, headache, sweats, rash, change in vision, change in hearing, chest pain, cough, nausea vomiting, urinary symptoms. All other systems are reviewed and are negative.  PHYSICAL EXAM Patient is oriented to person time and place. Affect is normal. There is no jugulovenous distention. Lungs are clear. Respiratory effort is nonlabored. Cardiac exam reveals S1 and S2. There no clicks or significant murmurs. His chest is nicely healed. The abdomen is soft. There is no peripheral edema. There are no musculoskeletal deformities. There are no skin rashes.  Filed Vitals:   04/18/12 0953  BP: 110/62  Pulse: 65  Height: 5\' 7"  (1.702 m)  Weight: 166 lb (75.297 kg)    ASSESSMENT & PLAN

## 2012-05-04 ENCOUNTER — Ambulatory Visit (INDEPENDENT_AMBULATORY_CARE_PROVIDER_SITE_OTHER): Payer: 59 | Admitting: *Deleted

## 2012-05-04 ENCOUNTER — Encounter (INDEPENDENT_AMBULATORY_CARE_PROVIDER_SITE_OTHER): Payer: 59

## 2012-05-04 DIAGNOSIS — I779 Disorder of arteries and arterioles, unspecified: Secondary | ICD-10-CM

## 2012-05-04 DIAGNOSIS — R0602 Shortness of breath: Secondary | ICD-10-CM

## 2012-05-04 DIAGNOSIS — I2510 Atherosclerotic heart disease of native coronary artery without angina pectoris: Secondary | ICD-10-CM

## 2012-05-04 DIAGNOSIS — I6529 Occlusion and stenosis of unspecified carotid artery: Secondary | ICD-10-CM

## 2012-05-04 LAB — LIPID PANEL
Cholesterol: 153 mg/dL (ref 0–200)
LDL Cholesterol: 100 mg/dL — ABNORMAL HIGH (ref 0–99)
VLDL: 9.2 mg/dL (ref 0.0–40.0)

## 2012-05-04 LAB — HEPATIC FUNCTION PANEL
Alkaline Phosphatase: 54 U/L (ref 39–117)
Bilirubin, Direct: 0.1 mg/dL (ref 0.0–0.3)
Total Bilirubin: 1 mg/dL (ref 0.3–1.2)
Total Protein: 6.9 g/dL (ref 6.0–8.3)

## 2012-05-09 ENCOUNTER — Encounter: Payer: Self-pay | Admitting: Cardiology

## 2012-05-17 ENCOUNTER — Other Ambulatory Visit: Payer: Self-pay

## 2012-05-17 DIAGNOSIS — E785 Hyperlipidemia, unspecified: Secondary | ICD-10-CM

## 2012-05-17 NOTE — Progress Notes (Signed)
Pt was notified.  

## 2012-07-13 ENCOUNTER — Other Ambulatory Visit: Payer: Self-pay | Admitting: Cardiology

## 2012-10-23 ENCOUNTER — Encounter: Payer: Self-pay | Admitting: Cardiology

## 2012-10-23 ENCOUNTER — Ambulatory Visit (INDEPENDENT_AMBULATORY_CARE_PROVIDER_SITE_OTHER): Payer: 59 | Admitting: Cardiology

## 2012-10-23 VITALS — BP 123/85 | HR 72 | Ht 67.0 in | Wt 164.1 lb

## 2012-10-23 DIAGNOSIS — I779 Disorder of arteries and arterioles, unspecified: Secondary | ICD-10-CM

## 2012-10-23 DIAGNOSIS — E785 Hyperlipidemia, unspecified: Secondary | ICD-10-CM

## 2012-10-23 DIAGNOSIS — R0602 Shortness of breath: Secondary | ICD-10-CM

## 2012-10-23 DIAGNOSIS — I2510 Atherosclerotic heart disease of native coronary artery without angina pectoris: Secondary | ICD-10-CM

## 2012-10-23 NOTE — Assessment & Plan Note (Addendum)
His last lipids were drawn May, 2013. He had significant improvement with treatment. However he had not reached goal. We will arrange for followup fasting lipids and make further decisions about his meds.

## 2012-10-23 NOTE — Assessment & Plan Note (Signed)
He does have significant carotid disease. This was first diagnosed in October, 2012. His followup Doppler in May, 2013 showed that his lesions were stable but he does have 60-79% R. ICA. I will be sure that his next followup Dopplers scheduled.

## 2012-10-23 NOTE — Patient Instructions (Addendum)
Your physician recommends that you return for lab work in: fasting lipid when you come back for your carotid doppler  Your physician wants you to follow-up in: 1 year.   You will receive a reminder letter in the mail two months in advance. If you don't receive a letter, please call our office to schedule the follow-up appointment.  Your physician has requested that you have a carotid duplex. This test is an ultrasound of the carotid arteries in your neck. It looks at blood flow through these arteries that supply the brain with blood. Allow one hour for this exam. There are no restrictions or special instructions.

## 2012-10-23 NOTE — Progress Notes (Signed)
Patient ID: Scott Marshall, male   DOB: 1951/05/31, 61 y.o.   MRN: [id number]   HPI   Patient is seen to followup coronary disease. I saw him last April, 2013. He underwent CABG in October, 2012. Prior to that he had no known coronary disease. He continues to exercise and do well. He's not having any of the shortness of breath or chest pain that he had. He has not seen his primary physician. I've encouraged him to do so.  No Known Allergies  Current Outpatient Prescriptions  Medication Sig Dispense Refill  . aspirin 325 MG tablet Take 325 mg by mouth daily.        . CRESTOR 40 MG tablet TAKE 1 TABLET (40 MG TOTAL) BY MOUTH AT BEDTIME.  30 tablet  6    History   Social History  . Marital Status: Married    Spouse Name: N/A    Number of Children: 0  . Years of Education: N/A   Occupational History  . management @ [employer]    Social History Main Topics  . Smoking status: Former Smoker    Quit date: 12/20/1984  . Smokeless tobacco: Not on file  . Alcohol Use: Yes     Comment: 2-3 a year maybe  . Drug Use: No  . Sexually Active: Not on file   Other Topics Concern  . Not on file   Social History Narrative  . No narrative on file    Family History  Problem Relation Age of Onset  . Coronary artery disease    . Hypertension    . Stroke Mother   . Thyroid disease Mother   . Dementia Mother   . Heart attack Father     Past Medical History  Diagnosis Date  . Fatigue   . Carotid artery disease     Doppler, October, 2012, 60-79% R. ICA  . Shortness of breath     October, 2012, This was an anginal equivalent  . CAD (coronary artery disease)     October, 2012,, catheterization done, CABG  . Hx of CABG     October, 2012, Dr. Dorris Fetch.   . Ejection fraction      EF 65%, catheterization, October, 2012    Past Surgical History  Procedure Date  . Coronary artery bypass graft 09/2011  . Tonsillectomy     Patient Active Problem List  Diagnosis  . Shortness of breath    . Fatigue  . Carotid artery disease  . CAD (coronary artery disease)  . Hx of CABG  . Ejection fraction    ROS   Patient denies fever, chills, headache, sweats, rash, change in vision, change in hearing, chest pain, cough, nausea vomiting, urinary symptoms. All other systems are reviewed and are negative.  PHYSICAL EXAM   Patient is oriented to person time and place. Affect is normal. Lungs are clear. Respiratory effort is nonlabored. Cardiac exam reveals S1 and S2. There no clicks or significant murmurs. The abdomen is soft. There is no peripheral edema. There no musculoskeletal deformities. There are no skin rashes.  Filed Vitals:   10/23/12 0906  BP: 123/85  Pulse: 72  Height: 5\' 7"  (1.702 m)  Weight: 164 lb 1.9 oz (74.444 kg)  SpO2: 99%   EKG is done today and reviewed by me. There is normal sinus rhythm. The EKG is normal. There is no change from the past. ASSESSMENT & PLAN

## 2012-10-23 NOTE — Assessment & Plan Note (Signed)
The patient has had no return of shortness of breath. This was his major symptom before his bypass surgery one year ago. No further workup.

## 2012-10-23 NOTE — Assessment & Plan Note (Signed)
Coronary disease is stable. He is now been on full dose aspirin for one year after CABG. His aspirin can be cut back to 81 mg daily. I have instructed him to do so.

## 2012-11-03 ENCOUNTER — Other Ambulatory Visit (INDEPENDENT_AMBULATORY_CARE_PROVIDER_SITE_OTHER): Payer: 59

## 2012-11-03 ENCOUNTER — Encounter (INDEPENDENT_AMBULATORY_CARE_PROVIDER_SITE_OTHER): Payer: 59

## 2012-11-03 DIAGNOSIS — E785 Hyperlipidemia, unspecified: Secondary | ICD-10-CM

## 2012-11-03 DIAGNOSIS — I6529 Occlusion and stenosis of unspecified carotid artery: Secondary | ICD-10-CM

## 2012-11-03 LAB — LIPID PANEL
Cholesterol: 163 mg/dL (ref 0–200)
Total CHOL/HDL Ratio: 4
Triglycerides: 76 mg/dL (ref 0.0–149.0)

## 2012-11-13 ENCOUNTER — Encounter: Payer: Self-pay | Admitting: Cardiology

## 2013-02-03 ENCOUNTER — Other Ambulatory Visit: Payer: Self-pay

## 2013-02-09 ENCOUNTER — Other Ambulatory Visit: Payer: Self-pay

## 2013-02-09 MED ORDER — ROSUVASTATIN CALCIUM 40 MG PO TABS: 40.0000 mg | ORAL_TABLET | Freq: Every day | ORAL | Status: DC

## 2013-05-09 ENCOUNTER — Encounter (INDEPENDENT_AMBULATORY_CARE_PROVIDER_SITE_OTHER): Payer: 59

## 2013-05-09 DIAGNOSIS — I6529 Occlusion and stenosis of unspecified carotid artery: Secondary | ICD-10-CM

## 2013-05-18 ENCOUNTER — Encounter: Payer: Self-pay | Admitting: Cardiology

## 2013-08-30 ENCOUNTER — Other Ambulatory Visit: Payer: Self-pay | Admitting: Cardiology

## 2013-09-05 ENCOUNTER — Other Ambulatory Visit: Payer: Self-pay | Admitting: Cardiology

## 2013-10-23 ENCOUNTER — Ambulatory Visit (INDEPENDENT_AMBULATORY_CARE_PROVIDER_SITE_OTHER): Payer: 59 | Admitting: Cardiology

## 2013-10-23 ENCOUNTER — Encounter: Payer: Self-pay | Admitting: Cardiology

## 2013-10-23 VITALS — BP 126/78 | HR 59 | Ht 67.0 in | Wt 169.0 lb

## 2013-10-23 DIAGNOSIS — R0602 Shortness of breath: Secondary | ICD-10-CM

## 2013-10-23 DIAGNOSIS — I779 Disorder of arteries and arterioles, unspecified: Secondary | ICD-10-CM

## 2013-10-23 DIAGNOSIS — E785 Hyperlipidemia, unspecified: Secondary | ICD-10-CM

## 2013-10-23 DIAGNOSIS — I2510 Atherosclerotic heart disease of native coronary artery without angina pectoris: Secondary | ICD-10-CM

## 2013-10-23 MED ORDER — ROSUVASTATIN CALCIUM 40 MG PO TABS: 40.0000 mg | ORAL_TABLET | Freq: Every day | ORAL | Status: DC

## 2013-10-23 NOTE — Assessment & Plan Note (Signed)
Patient has been on Crestor 40 mg. Lipids checked last year revealed that he had a good response but that his LDL was still in the range of 100. He has asked if he could be switched to a less expensive medicine. At this point we will check a fasting lipid profile. He will remain on Crestor 40 mg daily. After I have his lab results I will decide if he can be switched to 80 mg of Lipitor or not.

## 2013-10-23 NOTE — Assessment & Plan Note (Signed)
Shortness of breath was his original symptom. He has not had any return of shortness of breath. No further workup.

## 2013-10-23 NOTE — Progress Notes (Signed)
    HPI  Patient is seen today to followup coronary disease, dyslipidemia, carotid artery disease. He underwent CABG October, 2012. He's done very well. He's not having chest pain or shortness of breath. He exercises regularly.  No Known Allergies  Current Outpatient Prescriptions  Medication Sig Dispense Refill  . aspirin 81 MG tablet Take 81 mg by mouth daily.      . CRESTOR 40 MG tablet TAKE 1 TABLET BY MOUTH EVERY DAY  30 tablet  1   No current facility-administered medications for this visit.    History   Social History  . Marital Status: Married    Spouse Name: N/A    Number of Children: 0  . Years of Education: N/A   Occupational History  . management @ [employer]    Social History Main Topics  . Smoking status: Former Smoker    Quit date: 12/20/1984  . Smokeless tobacco: Not on file  . Alcohol Use: Yes     Comment: 2-3 a year maybe  . Drug Use: No  . Sexual Activity: Not on file   Other Topics Concern  . Not on file   Social History Narrative  . No narrative on file    Family History  Problem Relation Age of Onset  . Coronary artery disease    . Hypertension    . Stroke Mother   . Thyroid disease Mother   . Dementia Mother   . Heart attack Father     Past Medical History  Diagnosis Date  . Fatigue   . Carotid artery disease     Doppler, October, 2012, 60-79% R. ICA  . Shortness of breath     October, 2012, This was an anginal equivalent  . CAD (coronary artery disease)     October, 2012,, catheterization done, CABG  . Hx of CABG     October, 2012, Dr. Dorris Fetch.   . Ejection fraction      EF 65%, catheterization, October, 2012  . Dyslipidemia     Past Surgical History  Procedure Laterality Date  . Coronary artery bypass graft  09/2011  . Tonsillectomy      Patient Active Problem List   Diagnosis Date Noted  . Dyslipidemia   . Ejection fraction   . Carotid artery disease   . CAD (coronary artery disease)   . Hx of CABG   .  Shortness of breath   . Fatigue     ROS   Patient denies fever, chills, headache, sweats, rash, change in vision, change in hearing, chest pain, cough, nausea vomiting, urinary symptoms. All other systems are reviewed and are negative.  PHYSICAL EXAM  Patient is oriented to person time and place. Affect is normal. There is no jugulovenous distention. There are carotid bruits. Lungs are clear. Respiratory effort is nonlabored. Cardiac exam reveals S1 and S2. There no clicks or significant murmurs. The abdomen is soft. There is no peripheral edema. There no musculoskeletal deformities. There are no skin rashes.  Filed Vitals:   10/23/13 0901  BP: 126/78  Pulse: 59  Height: 5\' 7"  (1.702 m)  Weight: 169 lb (76.658 kg)   EKG is done today and reviewed by me. The EKG is normal. There is no significant change.  ASSESSMENT & PLAN

## 2013-10-23 NOTE — Patient Instructions (Signed)
Your physician has requested that you have an echocardiogram. Echocardiography is a painless test that uses sound waves to create images of your heart. It provides your doctor with information about the size and shape of your heart and how well your heart's chambers and valves are working. This procedure takes approximately one hour. There are no restrictions for this procedure.  Your physician recommends that you return for lab work in: same day as Echo. Please do not eat or drink after midnight the night before labs are drawn.  Your physician wants you to follow-up in: 1 year. You will receive a reminder letter in the mail two months in advance. If you don't receive a letter, please call our office to schedule the follow-up appointment.

## 2013-10-23 NOTE — Assessment & Plan Note (Signed)
The patient's coronary status is stable. He's not having his original symptom of shortness of breath with exertion. He is continuing to exercise. He does not smoke.  As part of today's evaluation I spent greater than 25 minutes with his overall care. More than half of this time has been spent carefully reviewing his need for studies and his current medications and his overall status.

## 2013-10-23 NOTE — Assessment & Plan Note (Signed)
He has significant carotid artery disease with 60-79% bilateral disease. His disease was stable in May, 2014. There is recommendation for 6 month followup. He asked me if I felt this could wait for a year. I felt that it was most prudent to continue the current recommendations with a 6 month followup.

## 2013-10-25 ENCOUNTER — Other Ambulatory Visit: Payer: Self-pay

## 2013-11-06 ENCOUNTER — Other Ambulatory Visit (HOSPITAL_COMMUNITY): Payer: 59

## 2013-11-08 IMAGING — CR DG CHEST 2V
2 series · 2 of 2 positions shown · non-contrast
Comparison: Chest x-ray of 10/16/2011

CLINICAL DATA: Cardiac surgery 5 weeks ago, follow-up

CHEST - 2 VIEW

[w chest pa]
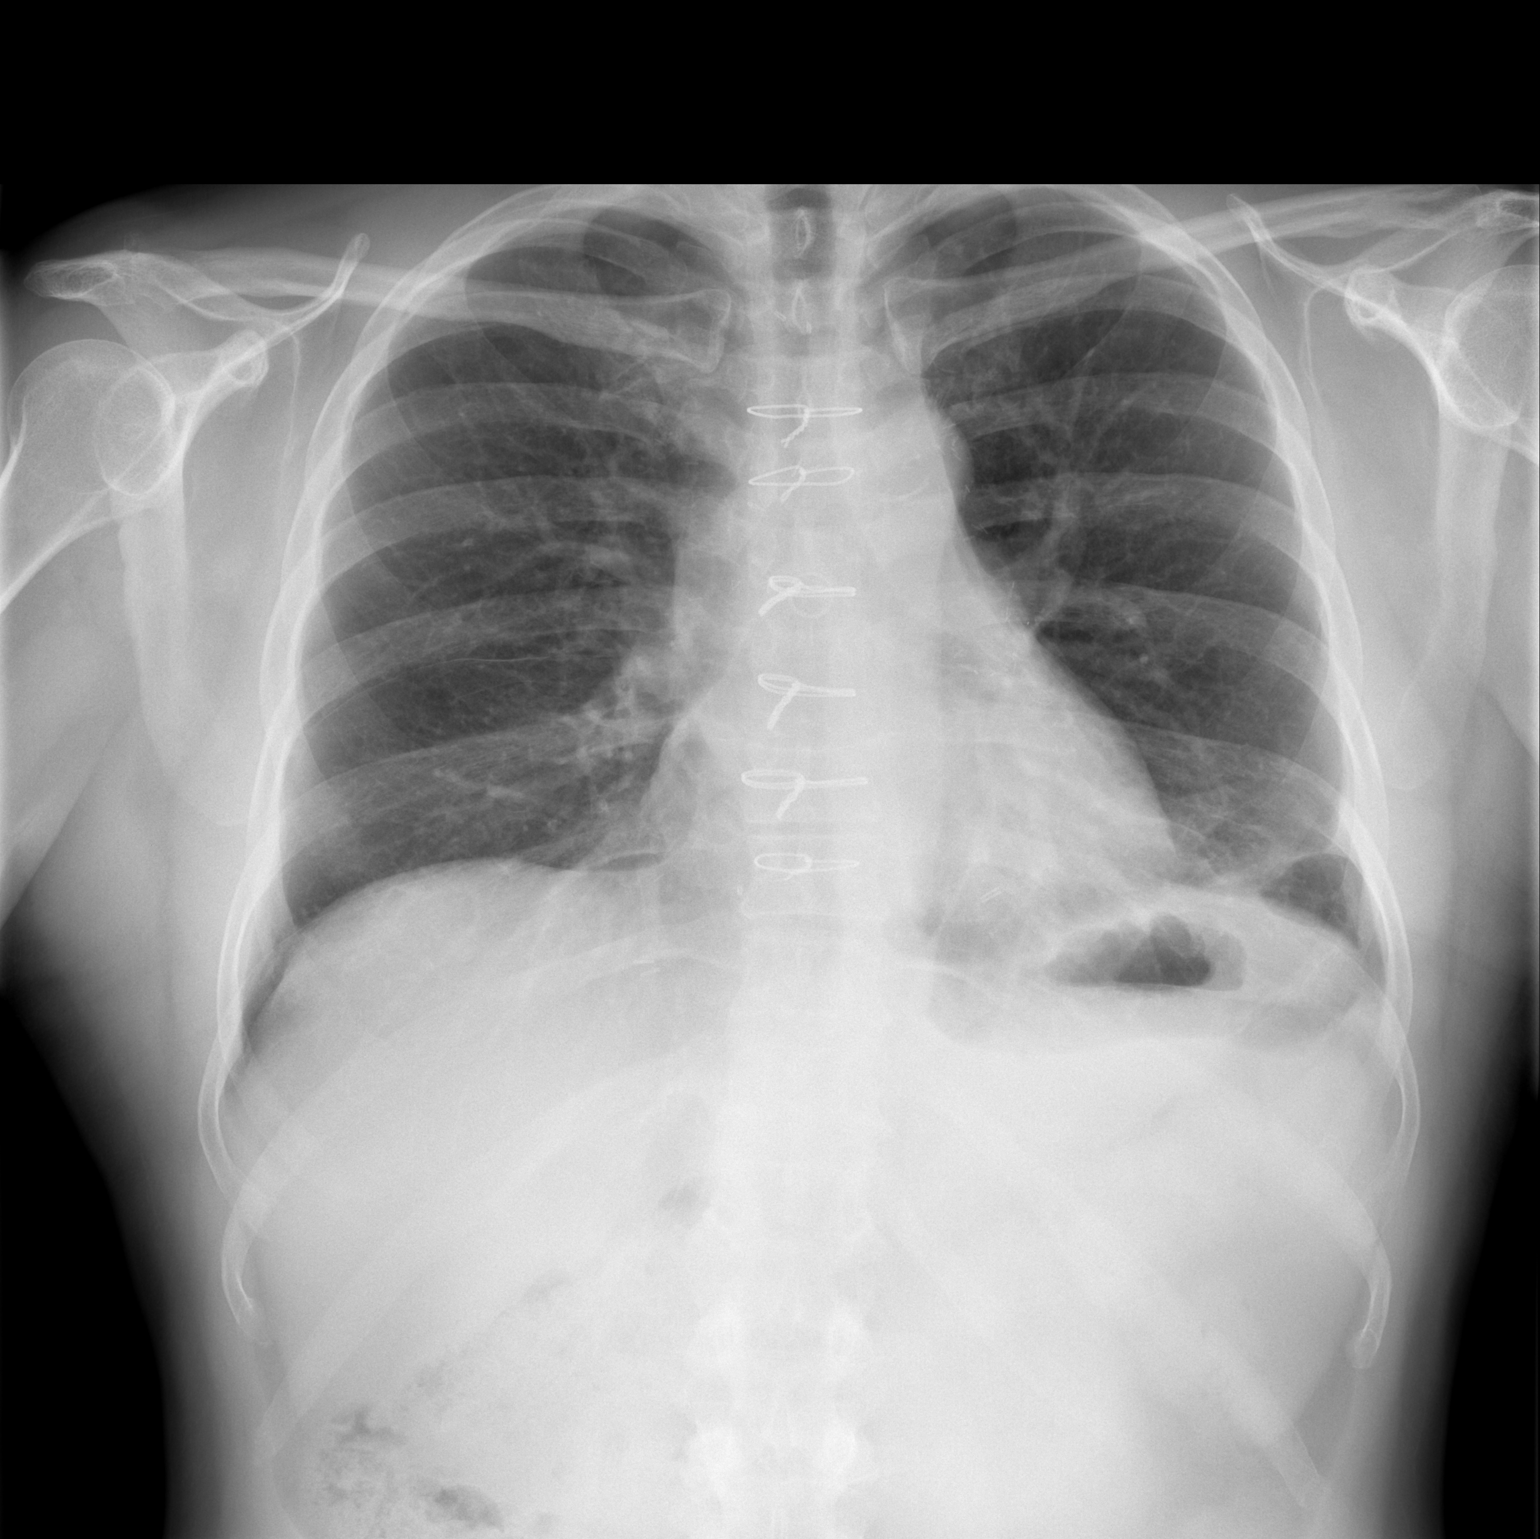

[w chest lat]
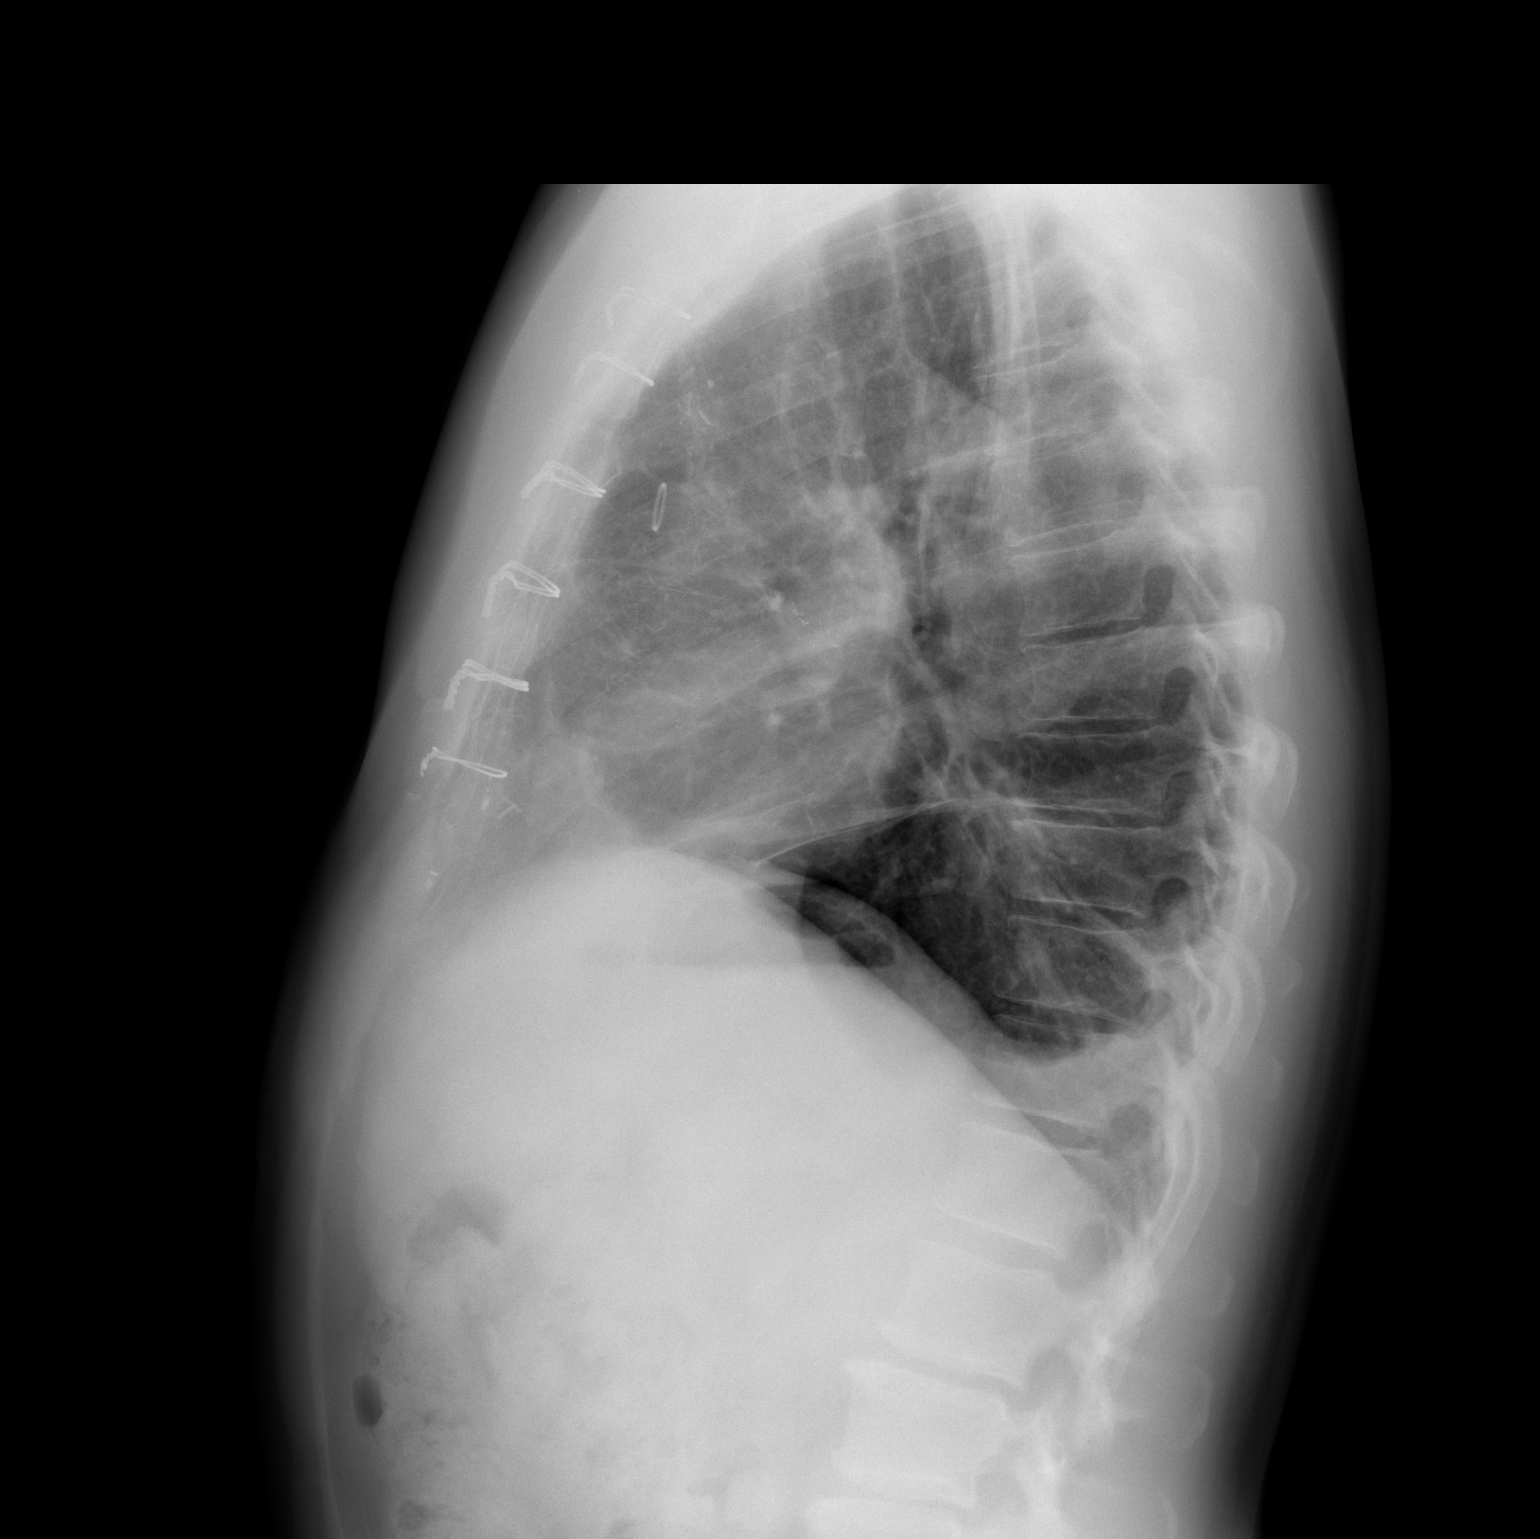

[2 of 2 positions shown; findings below may reference images not displayed]

FINDINGS: The small right pleural effusion has resolved.  Only a
small left effusion remains with mild left basilar atelectasis.  No
pneumothorax is seen.  Heart size is stable.  Median sternotomy
sutures are noted.
IMPRESSION: 1.  Small left pleural effusion remains with left basilar
atelectasis.
2.  Resolution of small right pleural effusion.

## 2013-11-09 ENCOUNTER — Ambulatory Visit (HOSPITAL_COMMUNITY): Payer: 59 | Attending: Cardiology

## 2013-11-09 ENCOUNTER — Encounter: Payer: Self-pay | Admitting: Cardiovascular Disease

## 2013-11-09 ENCOUNTER — Other Ambulatory Visit: Payer: 59

## 2013-11-09 DIAGNOSIS — I6529 Occlusion and stenosis of unspecified carotid artery: Secondary | ICD-10-CM

## 2013-11-09 DIAGNOSIS — I658 Occlusion and stenosis of other precerebral arteries: Secondary | ICD-10-CM | POA: Insufficient documentation

## 2013-11-09 DIAGNOSIS — I2510 Atherosclerotic heart disease of native coronary artery without angina pectoris: Secondary | ICD-10-CM | POA: Insufficient documentation

## 2013-11-09 DIAGNOSIS — Z951 Presence of aortocoronary bypass graft: Secondary | ICD-10-CM | POA: Insufficient documentation

## 2013-11-09 DIAGNOSIS — E785 Hyperlipidemia, unspecified: Secondary | ICD-10-CM | POA: Insufficient documentation

## 2013-11-09 LAB — LIPID PANEL
HDL: 39.3 mg/dL (ref >39.00)
Total CHOL/HDL Ratio: 4
VLDL: 15.2 mg/dL (ref 0.0–40.0)

## 2013-11-12 ENCOUNTER — Encounter: Payer: Self-pay | Admitting: Family Medicine

## 2013-11-12 ENCOUNTER — Encounter: Payer: Self-pay | Admitting: Cardiology

## 2013-11-12 ENCOUNTER — Ambulatory Visit (INDEPENDENT_AMBULATORY_CARE_PROVIDER_SITE_OTHER): Payer: 59 | Admitting: Family Medicine

## 2013-11-12 VITALS — BP 130/80 | HR 80 | Temp 98.8°F | Resp 16 | Ht 66.0 in | Wt 163.0 lb

## 2013-11-12 DIAGNOSIS — Z8719 Personal history of other diseases of the digestive system: Secondary | ICD-10-CM

## 2013-11-12 DIAGNOSIS — R946 Abnormal results of thyroid function studies: Secondary | ICD-10-CM

## 2013-11-12 DIAGNOSIS — Z23 Encounter for immunization: Secondary | ICD-10-CM

## 2013-11-12 DIAGNOSIS — R7989 Other specified abnormal findings of blood chemistry: Secondary | ICD-10-CM

## 2013-11-12 DIAGNOSIS — Z0000 Encounter for general adult medical examination without abnormal findings: Secondary | ICD-10-CM

## 2013-11-12 LAB — CBC WITH DIFFERENTIAL/PLATELET
Basophils Absolute: 0 10*3/uL (ref 0.0–0.1)
HCT: 46.4 % (ref 39.0–52.0)
Lymphocytes Relative: 27 % (ref 12–46)
Monocytes Absolute: 0.6 10*3/uL (ref 0.1–1.0)
Neutro Abs: 4.2 10*3/uL (ref 1.7–7.7)
Neutrophils Relative %: 63 % (ref 43–77)
Platelets: 160 10*3/uL (ref 150–400)
RDW: 13.7 % (ref 11.5–15.5)
WBC: 6.8 10*3/uL (ref 4.0–10.5)

## 2013-11-12 LAB — POCT URINALYSIS DIPSTICK
Blood, UA: NEGATIVE
Glucose, UA: NEGATIVE
Spec Grav, UA: >=1.03
pH, UA: 5.5

## 2013-11-12 LAB — POCT UA - MICROSCOPIC ONLY
Bacteria, U Microscopic: NEGATIVE
WBC, Ur, HPF, POC: NEGATIVE

## 2013-11-12 LAB — IFOBT (OCCULT BLOOD): IFOBT: NEGATIVE

## 2013-11-12 LAB — COMPREHENSIVE METABOLIC PANEL
ALT: 35 U/L (ref 0–53)
AST: 38 U/L — ABNORMAL HIGH (ref 0–37)
Albumin: 4.9 g/dL (ref 3.5–5.2)
Calcium: 10.2 mg/dL (ref 8.4–10.5)
Chloride: 101 mEq/L (ref 96–112)
Potassium: 4.3 mEq/L (ref 3.5–5.3)
Total Bilirubin: 1.1 mg/dL (ref 0.3–1.2)
Total Protein: 7.4 g/dL (ref 6.0–8.3)

## 2013-11-12 MED ORDER — ZOSTER VACCINE LIVE 19400 UNT/0.65ML ~~LOC~~ SOLR: 0.6500 mL | Freq: Once | SUBCUTANEOUS | Status: DC

## 2013-11-12 NOTE — Progress Notes (Signed)
Subjective:    Patient ID: Scott Marshall, male    DOB: [birth year], 62 y.o.   MRN: [id number]  HPI Scott Marshall is a 62 y.o. male Here for CPE.  Last seen by me in July 2012 for hyperlipidemia, HTN med reills and borderline TSH.  Since that time seen by Dr., Patsy Lager in Oct 2012 with fatigue, decreased exercise tolerance and referred to cardiology. CAD noted on cath 2012, then CABG by Dr. Dorris Fetch in October 2012.   Last cardiology eval 10/23/13 - Dr. Myrtis Ser. The patient's coronary status was stable per notes at that time, and continuing to exercise. He does not smoke.  Off bp meds for past year, still on Crestor 40mg  qd, and ASA 81mg  qd.  Followed every 6 months with dopplers for carotid artery stenosis? Unchanged prior.   No dyspnea with exercise.  Annual exam: Colonoscopy: normal in 2008 (Dr. Kinnie Scales)- recommended repeat in 5-10 years. Did have blood in stool on CPE in 01/2010 - plan to see GI, but has not seen them in follow up. Denies blood in stool or dark/tarry stools.  PSA: 0.58 in 09/2011. No known FH of prostate CA. Agrees to testing today. No dysuria, no hematuria  Tdap: 6/10 Influenza vaccine: not this year - will have today.  Zostavax: has not had.   Hx of elevated TSH prior - noted TSh 6.9 in Oct 2012, but normal T3.  Lab Results  Component Value Date   TSH 6.949* 10/13/2011  no unexplained weight changes,  change in hair or skin, no palpitations. Has not had TSH rechecked recently.  Results for orders placed in visit on 11/09/13  LIPID PANEL      Result Value Range   Cholesterol 151  0 - 200 mg/dL   Triglycerides 30.8  0.0 - 149.0 mg/dL   HDL 65.78  >46.96 mg/dL   VLDL 29.5  0.0 - 28.4 mg/dL   LDL Cholesterol 97  0 - 99 mg/dL   Total CHOL/HDL Ratio 4           Patient Active Problem List   Diagnosis Date Noted  . Dyslipidemia   . Ejection fraction   . Carotid artery disease   . CAD (coronary artery disease)   . Hx of CABG   . Shortness of breath   .  Fatigue    Past Medical History  Diagnosis Date  . Fatigue   . Carotid artery disease     Doppler, October, 2012, 60-79% R. ICA  . Shortness of breath     October, 2012, This was an anginal equivalent  . CAD (coronary artery disease)     October, 2012,, catheterization done, CABG  . Hx of CABG     October, 2012, Dr. Dorris Fetch.   . Ejection fraction      EF 65%, catheterization, October, 2012  . Dyslipidemia    Past Surgical History  Procedure Laterality Date  . Coronary artery bypass graft  09/2011  . Tonsillectomy     No Known Allergies Prior to Admission medications   Medication Sig Start Date End Date Taking? Authorizing Provider  aspirin 81 MG tablet Take 81 mg by mouth daily.   Yes Historical Provider, MD  rosuvastatin (CRESTOR) 40 MG tablet Take 1 tablet (40 mg total) by mouth daily. 10/23/13  Yes Luis Abed, MD   History   Social History  . Marital Status: Married    Spouse Name: N/A    Number of Children: 0  .  Years of Education: N/A   Occupational History  . management @ [employer]    Social History Main Topics  . Smoking status: Former Smoker    Quit date: 12/20/1984  . Smokeless tobacco: Not on file  . Alcohol Use: Yes     Comment: 2-3 a year maybe  . Drug Use: No  . Sexual Activity: Not on file   Other Topics Concern  . Not on file   Social History Narrative  . 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   Review of Systems  13 point review of systems per patient health survey noted.  Negative other than as indicated on reviewed nursing note.      Objective:   Physical Exam  Vitals reviewed. Constitutional: He is oriented to person, place, and time. He appears well-developed and well-nourished.  HENT:  Head: Normocephalic and atraumatic.  Right Ear: External ear normal.  Left Ear: External ear normal.  Mouth/Throat: Oropharynx is clear and moist.  Eyes: Conjunctivae and EOM are normal. Pupils are  equal, round, and reactive to light.  Neck: Normal range of motion. Neck supple. No thyromegaly present.  Cardiovascular: Normal rate, regular rhythm, normal heart sounds and intact distal pulses.   Pulmonary/Chest: Effort normal and breath sounds normal. No respiratory distress. He has no wheezes.  Abdominal: Soft. He exhibits no distension. There is no tenderness. Hernia confirmed negative in the right inguinal area and confirmed negative in the left inguinal area.  Genitourinary: Prostate normal.  Musculoskeletal: Normal range of motion. He exhibits no edema and no tenderness.  Lymphadenopathy:    He has no cervical adenopathy.  Neurological: He is alert and oriented to person, place, and time. He has normal reflexes.  Skin: Skin is warm and dry.  Psychiatric: He has a normal mood and affect. His behavior is normal.    Filed Vitals:   11/12/13 1511  BP: 130/80  Pulse: 80  Temp: 98.8 F (37.1 C)  TempSrc: Oral  Resp: 16  Height: 5\' 6"  (1.676 m)  Weight: 163 lb (73.936 kg)  SpO2: 94%   Results for orders placed in visit on 11/12/13  IFOBT (OCCULT BLOOD)      Result Value Range   IFOBT Negative    POCT UA - MICROSCOPIC ONLY      Result Value Range   WBC, Ur, HPF, POC neg     RBC, urine, microscopic 0-2     Bacteria, U Microscopic neg     Mucus, UA large     Epithelial cells, urine per micros 0-1     Crystals, Ur, HPF, POC neg     Casts, Ur, LPF, POC neg     Yeast, UA neg    POCT URINALYSIS DIPSTICK      Result Value Range   Color, UA yellow     Clarity, UA clear     Glucose, UA neg     Bilirubin, UA small     Ketones, UA trace     Spec Grav, UA >=1.030     Blood, UA neg     pH, UA 5.5     Protein, UA neg     Urobilinogen, UA 0.2     Nitrite, UA neg     Leukocytes, UA Negative         Assessment & Plan:   Scott Marshall is a 62 y.o. male Routine general medical examination at a health care facility - Plan:  CBC with Differential, Comprehensive metabolic panel,  PSA, POCT UA - Microscopic Only, POCT urinalysis dipstick  -anticipatory guidance, labs above. Recent lipid panel by cardiology, and has Crestor rx by cardiology.   Elevated TSH - Plan: TSH repeated as borderline in past without recent recheck.   History of rectal bleeding - Plan: IFOBT POC (occult bld, rslt in office) - negative. Due for repeat colonoscopy in 5-10 years form 2008, and by his report, did receive some calls from GI at 5 years to have this repeated, but apparently was normal, and plans to repeat at 10 year interval. Encouraged to clarify reason from GI for repeat at 5 years.   Need for zoster vaccination - Plan: zoster vaccine live, PF, (ZOSTAVAX) 25366 UNT/0.65ML injection rx given to fill at his pharmacy.   Need for prophylactic vaccination and inoculation against influenza - Plan: Flu Vaccine QUAD 36+ mos IM given.   Hx of CAD, s/p CABG. Stable and followed by cardiology.  continue routine follow up, continue asa, crestor.    Meds ordered this encounter  Medications  . zoster vaccine live, PF, (ZOSTAVAX) 44034 UNT/0.65ML injection    Sig: Inject 19,400 Units into the skin once.    Dispense:  1 each    Refill:  0   Patient Instructions  We will check the thyroid test again as it has been elevated in the past.  You can fill the shingles vaccine at your convenience. You should receive a call or letter about your lab results within the next week to 10 days.  If needed, please return to discuss your knee pain.  Test for blood in stool negative today.  Plan on repeat colonoscopy in 2018, unless told less than 10 years by your gastroenterologist, or any rectal bleeding prior to that time.   Keeping you healthy  Get these tests  Blood pressure- Have your blood pressure checked once a year by your healthcare provider.  Normal blood pressure is 120/80  Weight- Have your body mass index (BMI) calculated to screen for obesity.  BMI is a measure of body fat based on height and  weight. You can also calculate your own BMI at [email or web address].  Cholesterol- Have your cholesterol checked every year.  Diabetes- Have your blood sugar checked regularly if you have high blood pressure, high cholesterol, have a family history of diabetes or if you are overweight.  Screening for Colon Cancer- Colonoscopy starting at age 36.  Screening may begin sooner depending on your family history and other health conditions. Follow up colonoscopy as directed by your Gastroenterologist.  Screening for Prostate Cancer- Both blood work (PSA) and a rectal exam help screen for Prostate Cancer.  Screening begins at age 65 with African-American men and at age 28 with Caucasian men.  Screening may begin sooner depending on your family history.  Take these medicines  Aspirin- One aspirin daily can help prevent Heart disease and Stroke.  Flu shot- Every fall.  Tetanus- Every 10 years.  Zostavax- Once after the age of 40 to prevent Shingles.  Pneumonia shot- Once after the age of 88; if you are younger than 28, ask your healthcare provider if you need a Pneumonia shot.  Take these steps  Don't smoke- If you do smoke, talk to your doctor about quitting.  For tips on how to quit, go to www.smokefree.gov or call 1-800-QUIT-NOW.  Be physically active- Exercise 5 days a week for at least 30 minutes.  If you are not already physically active start  slow and gradually work up to 30 minutes of moderate physical activity.  Examples of moderate activity include walking briskly, mowing the yard, dancing, swimming, bicycling, etc.  Eat a healthy diet- Eat a variety of healthy food such as fruits, vegetables, low fat milk, low fat cheese, yogurt, lean meant, poultry, fish, beans, tofu, etc. For more information go to www.thenutritionsource.org  Drink alcohol in moderation- Limit alcohol intake to less than two drinks a day. Never drink and drive.  Dentist- Brush and floss twice daily; visit your  dentist twice a year.  Depression- Your emotional health is as important as your physical health. If you're feeling down, or losing interest in things you would normally enjoy please talk to your healthcare provider.  Eye exam- Visit your eye doctor every year.  Safe sex- If you may be exposed to a sexually transmitted infection, use a condom.  Seat belts- Seat belts can save your life; always wear one.  Smoke/Carbon Monoxide detectors- These detectors need to be installed on the appropriate level of your home.  Replace batteries at least once a year.  Skin cancer- When out in the sun, cover up and use sunscreen 15 SPF or higher.  Violence- If anyone is threatening you, please tell your healthcare provider.  Living Will/ Health care power of attorney- Speak with your healthcare provider and family.

## 2013-11-12 NOTE — Progress Notes (Signed)
  Subjective:    Patient ID: Scott Marshall, male    DOB: 1951/09/11, 62 y.o.   MRN: [id number]  HPI    Review of Systems  Constitutional: Negative.   HENT: Negative.   Eyes: Negative.   Respiratory: Negative.   Cardiovascular: Negative.   Gastrointestinal: Negative.   Endocrine: Negative.   Genitourinary: Negative.   Musculoskeletal: Negative.   Skin: Negative.   Allergic/Immunologic: Negative.   Neurological: Negative.   Hematological: Negative.   Psychiatric/Behavioral: Negative.        Objective:   Physical Exam        Assessment & Plan:

## 2013-11-12 NOTE — Patient Instructions (Addendum)
We will check the thyroid test again as it has been elevated in the past.  You can fill the shingles vaccine at your convenience. You should receive a call or letter about your lab results within the next week to 10 days.  If needed, please return to discuss your knee pain.  Test for blood in stool negative today.  Plan on repeat colonoscopy in 2018, unless told less than 10 years by your gastroenterologist, or any rectal bleeding prior to that time.   Keeping you healthy  Get these tests  Blood pressure- Have your blood pressure checked once a year by your healthcare provider.  Normal blood pressure is 120/80  Weight- Have your body mass index (BMI) calculated to screen for obesity.  BMI is a measure of body fat based on height and weight. You can also calculate your own BMI at [email or web address].  Cholesterol- Have your cholesterol checked every year.  Diabetes- Have your blood sugar checked regularly if you have high blood pressure, high cholesterol, have a family history of diabetes or if you are overweight.  Screening for Colon Cancer- Colonoscopy starting at age 55.  Screening may begin sooner depending on your family history and other health conditions. Follow up colonoscopy as directed by your Gastroenterologist.  Screening for Prostate Cancer- Both blood work (PSA) and a rectal exam help screen for Prostate Cancer.  Screening begins at age 55 with African-American men and at age 16 with Caucasian men.  Screening may begin sooner depending on your family history.  Take these medicines  Aspirin- One aspirin daily can help prevent Heart disease and Stroke.  Flu shot- Every fall.  Tetanus- Every 10 years.  Zostavax- Once after the age of 93 to prevent Shingles.  Pneumonia shot- Once after the age of 83; if you are younger than 40, ask your healthcare provider if you need a Pneumonia shot.  Take these steps  Don't smoke- If you do smoke, talk to your doctor about  quitting.  For tips on how to quit, go to www.smokefree.gov or call 1-800-QUIT-NOW.  Be physically active- Exercise 5 days a week for at least 30 minutes.  If you are not already physically active start slow and gradually work up to 30 minutes of moderate physical activity.  Examples of moderate activity include walking briskly, mowing the yard, dancing, swimming, bicycling, etc.  Eat a healthy diet- Eat a variety of healthy food such as fruits, vegetables, low fat milk, low fat cheese, yogurt, lean meant, poultry, fish, beans, tofu, etc. For more information go to www.thenutritionsource.org  Drink alcohol in moderation- Limit alcohol intake to less than two drinks a day. Never drink and drive.  Dentist- Brush and floss twice daily; visit your dentist twice a year.  Depression- Your emotional health is as important as your physical health. If you're feeling down, or losing interest in things you would normally enjoy please talk to your healthcare provider.  Eye exam- Visit your eye doctor every year.  Safe sex- If you may be exposed to a sexually transmitted infection, use a condom.  Seat belts- Seat belts can save your life; always wear one.  Smoke/Carbon Monoxide detectors- These detectors need to be installed on the appropriate level of your home.  Replace batteries at least once a year.  Skin cancer- When out in the sun, cover up and use sunscreen 15 SPF or higher.  Violence- If anyone is threatening you, please tell your healthcare provider.  Living Will/ Health care power of  attorney- Speak with your healthcare provider and family.

## 2013-11-13 LAB — TSH: TSH: 6.301 u[IU]/mL — ABNORMAL HIGH (ref 0.350–4.500)

## 2013-11-14 ENCOUNTER — Other Ambulatory Visit: Payer: Self-pay

## 2013-11-14 ENCOUNTER — Other Ambulatory Visit: Payer: Self-pay | Admitting: Cardiology

## 2013-11-14 MED ORDER — ATORVASTATIN CALCIUM 80 MG PO TABS: 80.0000 mg | ORAL_TABLET | Freq: Every day | ORAL | Status: DC

## 2013-11-30 ENCOUNTER — Telehealth: Payer: Self-pay

## 2013-11-30 DIAGNOSIS — R7989 Other specified abnormal findings of blood chemistry: Secondary | ICD-10-CM

## 2013-11-30 NOTE — Telephone Encounter (Signed)
PATIENT CALLED RETURNING CALL FOR LAB RESULTS.   BEST:  [phone]

## 2013-12-03 NOTE — Telephone Encounter (Signed)
Yes - lab only order ok, then if abnormal - can have OV to discuss. TSH, free t4, t3

## 2013-12-03 NOTE — Telephone Encounter (Signed)
Can we do lab only in 6 weeks?

## 2013-12-03 NOTE — Telephone Encounter (Signed)
Called him to advise. Left message for him to call me back  

## 2013-12-03 NOTE — Telephone Encounter (Signed)
Notes Recorded by Shade Flood, MD on 11/26/2013 at 6:56 PM Call patient. Kidney tests, blood counts, electrolytes, prostate test were overall in normal range. One borderline elevated liver test, but only borderline and can recheck this in 6 months. Thyroid test was elevated again as when checked a few years ago, which may indicate underactive thyroid, but other confirmation tests can be done. Follow up with me in next 6 weeks to repeat this test and can add free t4/t3 tests then to make sure he dose not need to be on a thyroid medicine. rtc sooner if needed.

## 2013-12-04 NOTE — Telephone Encounter (Signed)
Patient advised.

## 2014-01-22 ENCOUNTER — Ambulatory Visit (INDEPENDENT_AMBULATORY_CARE_PROVIDER_SITE_OTHER): Payer: 59 | Admitting: Family Medicine

## 2014-01-22 VITALS — BP 140/90 | HR 60 | Temp 98.1°F | Resp 16 | Ht 66.0 in | Wt 170.0 lb

## 2014-01-22 DIAGNOSIS — L309 Dermatitis, unspecified: Secondary | ICD-10-CM

## 2014-01-22 DIAGNOSIS — L089 Local infection of the skin and subcutaneous tissue, unspecified: Secondary | ICD-10-CM

## 2014-01-22 DIAGNOSIS — R7989 Other specified abnormal findings of blood chemistry: Secondary | ICD-10-CM

## 2014-01-22 DIAGNOSIS — R946 Abnormal results of thyroid function studies: Secondary | ICD-10-CM

## 2014-01-22 DIAGNOSIS — L0291 Cutaneous abscess, unspecified: Secondary | ICD-10-CM

## 2014-01-22 LAB — POCT SKIN KOH: Skin KOH, POC: NEGATIVE

## 2014-01-22 MED ORDER — SULFAMETHOXAZOLE-TMP DS 800-160 MG PO TABS: 1.0000 | ORAL_TABLET | Freq: Two times a day (BID) | ORAL | Status: DC

## 2014-01-22 MED ORDER — TRIAMCINOLONE ACETONIDE 0.1 % EX CREA: 1.0000 "application " | TOPICAL_CREAM | Freq: Two times a day (BID) | CUTANEOUS | Status: DC

## 2014-01-22 NOTE — Progress Notes (Signed)
Patient ID: Marinell BlightRoy B Pitz MRN: [id number], DOB: [birth year], 63 y.o. Date of Encounter: 01/22/2014, 5:01 PM    PROCEDURE NOTE: Verbal consent obtained. Betadine prep per usual protocol. Local anesthesia obtained with 2% lidocaine plain.   1 cm incision made with 11 blade along lesion.  Culture taken. Scant purulence expressed. Moderate amount of sebaceous material expressed.  Lesion explored revealing no loculations. Packed with 1/4 plain packing.  Dressed. Wound care instructions including precautions with patient. Patient tolerated the procedure well. Recheck in 48 hours.      Grier MittsSigned, Kori Colin, PA-C 01/22/2014 5:01 PM

## 2014-01-22 NOTE — Progress Notes (Signed)
Subjective: 63 year old man who has hypothyroidism and was scheduled to return for a blood recheck. He has had a little bump under the skin of his left upper back below the axilla there is been there for a long time, but just recently it started bothering up more and getting redder. He decided while he was out he wanted to go and get it checked also.  No history of skin funguses  Objective: Erythematous area about 1 x 2 cm it has a flakiness to the surface of the skin and a slightly palpable bumpiness. No drainage. Initially I missed seeing a place which was individually short. He also has a 3 CM red nodule, tender centrally.  Assessment: Skin abscess chest wall Hypothyroidism Dermatitis on back, etiology unclear  Plan: I and.D. skin abscess Will need bactrim   Skin scraping If it is a fungus will treat for fungus. If not will treat with a cortisone cream

## 2014-01-22 NOTE — Addendum Note (Signed)
Addended by: Gerrianne ScalePAYNE, Belynda Pagaduan L on: 01/22/2014 05:16 PM   Modules accepted: Orders

## 2014-01-22 NOTE — Patient Instructions (Signed)
Take Bactrim (sulfa) twice daily for the chest wall infection  Use triamcinolone cream twice daily on the rash on the back  Return as directed

## 2014-01-23 LAB — T4, FREE: Free T4: 0.97 ng/dL (ref 0.80–1.80)

## 2014-01-23 LAB — TSH: TSH: 5.807 u[IU]/mL — AB (ref 0.350–4.500)

## 2014-01-23 LAB — T3, FREE: T3, Free: 2.9 pg/mL (ref 2.3–4.2)

## 2014-01-24 ENCOUNTER — Ambulatory Visit (INDEPENDENT_AMBULATORY_CARE_PROVIDER_SITE_OTHER): Payer: 59 | Admitting: Physician Assistant

## 2014-01-24 VITALS — BP 118/62 | HR 63 | Temp 98.3°F | Resp 16 | Ht 66.0 in | Wt 172.0 lb

## 2014-01-24 DIAGNOSIS — L089 Local infection of the skin and subcutaneous tissue, unspecified: Secondary | ICD-10-CM

## 2014-01-24 DIAGNOSIS — L723 Sebaceous cyst: Secondary | ICD-10-CM

## 2014-01-24 NOTE — Progress Notes (Signed)
Patient ID: Marinell BlightRoy B Dionisio MRN: [id number], DOB: 28-Sep-1951 63 y.o. Date of Encounter: 01/24/2014, 4:44 PM  Chief Complaint: Wound care   See previous note  HPI: 63 y.o. y/o male presents for wound care s/p I&D on 01/22/14 Doing well No issues or complaints Afebrile/ no chills No nausea or vomiting Tolerating Septra Pain resolved.  Daily dressing change Previous note reviewed  Past Medical History  Diagnosis Date  . Fatigue   . Carotid artery disease     Doppler, October, 2012, 60-79% R. ICA  . Shortness of breath     October, 2012, This was an anginal equivalent  . CAD (coronary artery disease)     October, 2012,, catheterization done, CABG  . Hx of CABG     October, 2012, Dr. Dorris FetchHendrickson.   . Ejection fraction      EF 65%, catheterization, October, 2012  . Dyslipidemia      Home Meds: Prior to Admission medications   Medication Sig Start Date End Date Taking? Authorizing Provider  aspirin 81 MG tablet Take 81 mg by mouth daily.   Yes Historical Provider, MD  atorvastatin (LIPITOR) 80 MG tablet Take 1 tablet (80 mg total) by mouth daily. 11/14/13  Yes Luis AbedJeffrey D Katz, MD  sulfamethoxazole-trimethoprim (BACTRIM DS) 800-160 MG per tablet Take 1 tablet by mouth 2 (two) times daily. 01/22/14  Yes Peyton Najjaravid H Hopper, MD  triamcinolone cream (KENALOG) 0.1 % Apply 1 application topically 2 (two) times daily. 01/22/14  Yes Peyton Najjaravid H Hopper, MD    Allergies: No Known Allergies  ROS: Constitutional: Afebrile, no chills Cardiovascular: negative for chest pain or palpitations Dermatological: Positive for wound. Negative for erythema, pain, or warmth.  GI: No nausea or vomiting   EXAM: Physical Exam: Blood pressure 118/62, pulse 63, temperature 98.3 F (36.8 C), resp. rate 16, height 5\' 6"  (1.676 m), weight 172 lb (78.019 kg), SpO2 99.00%., Body mass index is 27.77 kg/(m^2). General: Well developed, well nourished, in no acute distress. Nontoxic appearing. Head: Normocephalic, atraumatic,  sclera non-icteric.  Neck: Supple. Lungs: Breathing is unlabored. Heart: Normal rate. Skin:  Warm and moist. Dressing and packing in place. No induration, erythema, or tenderness to palpation. Neuro: Alert and oriented X 3. Moves all extremities spontaneously. Normal gait.  Psych:  Responds to questions appropriately with a normal affect.       PROCEDURE: Dressing and packing removed. No purulence expressed Wound bed healthy Irrigated with 1% plain lidocaine 5 cc. Repacked with small amount of 1/4 plain packing.  Dressing applied  LAB: Culture: reincubated for better growth  A/P: 63 y.o. y/o male with infected sebaceous cyst of chest wall as above s/p I&D on 01/22/14.  Wound care per above Continue Septra Pain well controlled Daily dressing changes Recheck 48 hours if needed. Ok to remove packing if he is comfortable with this, otherwise we can recheck him.  Grier MittsSigned, Kanijah Groseclose, PA-C 01/24/2014 4:44 PM

## 2014-01-25 LAB — WOUND CULTURE
Gram Stain: NONE SEEN
Gram Stain: NONE SEEN

## 2014-05-16 ENCOUNTER — Other Ambulatory Visit (HOSPITAL_COMMUNITY): Payer: Self-pay | Admitting: Cardiology

## 2014-05-16 DIAGNOSIS — I6529 Occlusion and stenosis of unspecified carotid artery: Secondary | ICD-10-CM

## 2014-05-22 ENCOUNTER — Ambulatory Visit (HOSPITAL_COMMUNITY): Payer: 59 | Attending: Cardiology | Admitting: Cardiology

## 2014-05-22 DIAGNOSIS — I6529 Occlusion and stenosis of unspecified carotid artery: Secondary | ICD-10-CM | POA: Insufficient documentation

## 2014-05-22 NOTE — Progress Notes (Signed)
Carotid duplex complete 

## 2014-05-23 ENCOUNTER — Encounter: Payer: Self-pay | Admitting: Cardiology

## 2014-06-06 ENCOUNTER — Other Ambulatory Visit: Payer: Self-pay

## 2014-06-06 ENCOUNTER — Ambulatory Visit (INDEPENDENT_AMBULATORY_CARE_PROVIDER_SITE_OTHER): Payer: 59 | Admitting: *Deleted

## 2014-06-06 DIAGNOSIS — E785 Hyperlipidemia, unspecified: Secondary | ICD-10-CM

## 2014-06-06 LAB — LIPID PANEL
Cholesterol: 148 mg/dL (ref 0–200)
HDL: 43.9 mg/dL (ref >39.00)
LDL CALC: 91 mg/dL (ref 0–99)
NONHDL: 104.1
Total CHOL/HDL Ratio: 3
Triglycerides: 67 mg/dL (ref 0.0–149.0)
VLDL: 13.4 mg/dL (ref 0.0–40.0)

## 2014-10-08 ENCOUNTER — Other Ambulatory Visit: Payer: Self-pay | Admitting: *Deleted

## 2014-10-08 MED ORDER — ATORVASTATIN CALCIUM 80 MG PO TABS: 80.0000 mg | ORAL_TABLET | Freq: Every day | ORAL | Status: DC

## 2014-10-29 ENCOUNTER — Ambulatory Visit: Payer: 59 | Admitting: Cardiology

## 2014-11-06 ENCOUNTER — Ambulatory Visit: Payer: 59 | Admitting: Cardiology

## 2014-11-13 ENCOUNTER — Ambulatory Visit (INDEPENDENT_AMBULATORY_CARE_PROVIDER_SITE_OTHER): Payer: 59 | Admitting: Cardiology

## 2014-11-13 ENCOUNTER — Encounter: Payer: Self-pay | Admitting: Cardiology

## 2014-11-13 VITALS — BP 129/80 | HR 60 | Ht 66.0 in | Wt 172.0 lb

## 2014-11-13 DIAGNOSIS — I779 Disorder of arteries and arterioles, unspecified: Secondary | ICD-10-CM

## 2014-11-13 DIAGNOSIS — I739 Peripheral vascular disease, unspecified: Secondary | ICD-10-CM

## 2014-11-13 DIAGNOSIS — E785 Hyperlipidemia, unspecified: Secondary | ICD-10-CM

## 2014-11-13 DIAGNOSIS — I25810 Atherosclerosis of coronary artery bypass graft(s) without angina pectoris: Secondary | ICD-10-CM

## 2014-11-13 MED ORDER — ATORVASTATIN CALCIUM 80 MG PO TABS: 80.0000 mg | ORAL_TABLET | Freq: Every day | ORAL | Status: DC

## 2014-11-13 NOTE — Assessment & Plan Note (Signed)
Coronary disease is stable. No further workup at this time. 

## 2014-11-13 NOTE — Progress Notes (Signed)
Patient ID: Scott Marshall, male   DOB: 01/28/1951, 63 y.o.   MRN: [id number]    HPI Patient is seen to follow-up coronary disease. He's doing well. He is always exercise significantly. Several years ago he had significant change in his exercise capacity. It turned out that he had three-vessel disease and underwent bypass surgery. Since that time he sound very well. He continues to have an excellent exercise program. 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  No Known Allergies  Current Outpatient Prescriptions  Medication Sig Dispense Refill  . aspirin 81 MG tablet Take 81 mg by mouth daily.    Marland Kitchen. atorvastatin (LIPITOR) 80 MG tablet Take 1 tablet (80 mg total) by mouth daily. 90 tablet 3   No current facility-administered medications for this visit.    History   Social History  . Marital Status: Married    Spouse Name: N/A    Number of Children: 0  . Years of Education: N/A   Occupational History  . management @ [employer]    Social History Main Topics  . Smoking status: Former Smoker    Quit date: 12/20/1984  . Smokeless tobacco: Not on file  . Alcohol Use: Yes     Comment: 2-3 a year maybe  . Drug Use: No  . Sexual Activity: Not on file   Other Topics Concern  . Not on file   Social History Narrative    Family History  Problem Relation Age of Onset  . Coronary artery disease    . Hypertension    . Stroke Mother   . Thyroid disease Mother   . Dementia Mother   . Heart attack Father   . Hyperlipidemia Brother     Past Medical History  Diagnosis Date  . Fatigue   . Carotid artery disease     Doppler, October, 2012, 60-79% R. ICA  . Shortness of breath     October, 2012, This was an anginal equivalent  . CAD (coronary artery disease)     October, 2012,, catheterization done, CABG  . Hx of CABG     October, 2012, Dr. Dorris FetchHendrickson.   . Ejection fraction      EF 65%, catheterization, October, 2012  .  Dyslipidemia     Past Surgical History  Procedure Laterality Date  . Coronary artery bypass graft  09/2011  . Tonsillectomy      Patient Active Problem List   Diagnosis Date Noted  . Dyslipidemia   . Ejection fraction   . Carotid artery disease   . CAD (coronary artery disease)   . Hx of CABG   . Shortness of breath   . Fatigue     ROS   Patient denies fever, chills, headache, sweats, rash, change in vision, change in hearing, chest pain, cough, nausea or vomiting, urinary symptoms. All other systems are reviewed and are negative.    PHYSICAL EXAM Patient is doing very well and looks good. He is oriented to person time and place. Affect is normal. Head is atraumatic. Sclera and conjunctiva are normal. There is no jugular venous distention. Lungs are clear. Respiratory effort is nonlabored. Cardiac exam reveals S1 and S2. There are no clicks or significant murmurs. The abdomen is soft. There is no peripheral edema. There are no musculoskeletal deformities. There are no skin rashes.  Filed Vitals:   11/13/14 0938  BP: 129/80  Pulse: 60  Height: 5\' 6"  (  1.676 m)  Weight: 172 lb (78.019 kg)   EKG is done today and reviewed by me. There is normal sinus rhythm. EKG is normal. There is no change from the past. ASSESSMENT & PLAN

## 2014-11-13 NOTE — Assessment & Plan Note (Signed)
The patient's carotids are being followed very carefully. It is now time for a six-month carotid Doppler follow-up. This is being scheduled.

## 2014-11-13 NOTE — Assessment & Plan Note (Signed)
He is on 80 mg of atorvastatin. This is guide line directed therapy. This is lead to a significant decrease in his LDL. However his LDL remains in the range of 91. I've had a lengthy discussion with him about the possibility of adding Zetia to his meds. We checked on the pricing from his pharmacy and the cost  was $230 per month. Based on this he is not willing to proceed with the addition of this medication. At this time I feel it is not appropriate to discuss the possibility of a PCSK9 drug.  As part of today's evaluation I spent greater than 25 minutes with his total care. More than half of this time has been with a lengthy discussion with him about his overall exercise program in his health. We discussed the approach to his cholesterol at great length.

## 2014-11-13 NOTE — Patient Instructions (Signed)
Your physician recommends that you continue on your current medications as directed. Please refer to the Current Medication list given to you today.   Your physician has requested that you have a carotid duplex. This test is an ultrasound of the carotid arteries in your neck. It looks at blood flow through these arteries that supply the brain with blood. Allow one hour for this exam. There are no restrictions or special instructions.    Your physician wants you to follow-up in: ONE YEAR WITH DR KATZ You will receive a reminder letter in the mail two months in advance. If you don't receive a letter, please call our office to schedule the follow-up appointment.  

## 2014-11-21 ENCOUNTER — Ambulatory Visit (HOSPITAL_COMMUNITY): Payer: 59 | Attending: Family Medicine | Admitting: Cardiology

## 2014-11-21 DIAGNOSIS — Z87891 Personal history of nicotine dependence: Secondary | ICD-10-CM | POA: Insufficient documentation

## 2014-11-21 DIAGNOSIS — I779 Disorder of arteries and arterioles, unspecified: Secondary | ICD-10-CM

## 2014-11-21 DIAGNOSIS — I6529 Occlusion and stenosis of unspecified carotid artery: Secondary | ICD-10-CM | POA: Diagnosis not present

## 2014-11-21 DIAGNOSIS — I6523 Occlusion and stenosis of bilateral carotid arteries: Secondary | ICD-10-CM

## 2014-11-21 DIAGNOSIS — Z951 Presence of aortocoronary bypass graft: Secondary | ICD-10-CM | POA: Insufficient documentation

## 2014-11-21 DIAGNOSIS — I739 Peripheral vascular disease, unspecified: Secondary | ICD-10-CM

## 2014-11-21 DIAGNOSIS — I2510 Atherosclerotic heart disease of native coronary artery without angina pectoris: Secondary | ICD-10-CM | POA: Diagnosis present

## 2014-11-21 DIAGNOSIS — E785 Hyperlipidemia, unspecified: Secondary | ICD-10-CM | POA: Insufficient documentation

## 2014-11-21 NOTE — Progress Notes (Signed)
Carotid duplex performed 

## 2014-11-26 ENCOUNTER — Telehealth: Payer: Self-pay | Admitting: Cardiology

## 2014-11-26 NOTE — Telephone Encounter (Signed)
Follow Up ° °Pt called back  °

## 2014-11-26 NOTE — Telephone Encounter (Signed)
The pt has been given his Carotid Duplex results and he verbalized understanding. 

## 2015-03-03 ENCOUNTER — Other Ambulatory Visit: Payer: Self-pay | Admitting: Cardiology

## 2015-05-29 ENCOUNTER — Ambulatory Visit (INDEPENDENT_AMBULATORY_CARE_PROVIDER_SITE_OTHER): Payer: 59 | Admitting: Family Medicine

## 2015-05-29 VITALS — BP 136/80 | HR 67 | Temp 98.1°F | Resp 14 | Ht 66.75 in | Wt 174.6 lb

## 2015-05-29 DIAGNOSIS — R7989 Other specified abnormal findings of blood chemistry: Secondary | ICD-10-CM | POA: Diagnosis not present

## 2015-05-29 DIAGNOSIS — I25810 Atherosclerosis of coronary artery bypass graft(s) without angina pectoris: Secondary | ICD-10-CM | POA: Diagnosis not present

## 2015-05-29 DIAGNOSIS — R5383 Other fatigue: Secondary | ICD-10-CM | POA: Diagnosis not present

## 2015-05-29 DIAGNOSIS — R5381 Other malaise: Secondary | ICD-10-CM | POA: Diagnosis not present

## 2015-05-29 LAB — COMPLETE METABOLIC PANEL WITH GFR
ALT: 35 U/L (ref 0–53)
AST: 32 U/L (ref 0–37)
Albumin: 4.4 g/dL (ref 3.5–5.2)
Alkaline Phosphatase: 73 U/L (ref 39–117)
BILIRUBIN TOTAL: 1 mg/dL (ref 0.2–1.2)
BUN: 18 mg/dL (ref 6–23)
CALCIUM: 9.4 mg/dL (ref 8.4–10.5)
CO2: 25 mEq/L (ref 19–32)
Chloride: 104 mEq/L (ref 96–112)
Creat: 1.05 mg/dL (ref 0.50–1.35)
GFR, Est African American: 88 mL/min
GFR, Est Non African American: 76 mL/min
Glucose, Bld: 88 mg/dL (ref 70–99)
POTASSIUM: 4.1 meq/L (ref 3.5–5.3)
SODIUM: 141 meq/L (ref 135–145)
TOTAL PROTEIN: 6.9 g/dL (ref 6.0–8.3)

## 2015-05-29 LAB — POCT CBC
Granulocyte percent: 71.9 %G (ref 37–80)
HEMATOCRIT: 47.4 % (ref 43.5–53.7)
Hemoglobin: 15.2 g/dL (ref 14.1–18.1)
Lymph, poc: 1.5 (ref 0.6–3.4)
MCH, POC: 29.7 pg (ref 27–31.2)
MCHC: 32.1 g/dL (ref 31.8–35.4)
MCV: 92.5 fL (ref 80–97)
MID (cbc): 0.6 (ref 0–0.9)
MPV: 7.8 fL (ref 0–99.8)
PLATELET COUNT, POC: 153 10*3/uL (ref 142–424)
POC Granulocyte: 5.4 (ref 2–6.9)
POC LYMPH %: 20.1 % (ref 10–50)
POC MID %: 8 %M (ref 0–12)
RBC: 5.13 M/uL (ref 4.69–6.13)
RDW, POC: 13.9 %
WBC: 7.5 10*3/uL (ref 4.6–10.2)

## 2015-05-29 LAB — GLUCOSE, POCT (MANUAL RESULT ENTRY): POC GLUCOSE: 82 mg/dL (ref 70–99)

## 2015-05-29 LAB — TSH: TSH: 4.888 u[IU]/mL — ABNORMAL HIGH (ref 0.350–4.500)

## 2015-05-29 NOTE — Patient Instructions (Signed)
I do not see any concerning findings on your EKG today.  I will check some electrolytes including blood sugar and blood counts, thyroid and call you with some of those results. You can call your cardiologist to get an office visit in next week or two if possible, but less likely heart related as not having symptoms with exercise. Follow up with me in next few weeks if these symptoms persist. Return to the clinic or go to the nearest emergency room if any of your symptoms worsen or new symptoms occur.  Fatigue Fatigue is a feeling of tiredness, lack of energy, lack of motivation, or feeling tired all the time. Having enough rest, good nutrition, and reducing stress will normally reduce fatigue. Consult your caregiver if it persists. The nature of your fatigue will help your caregiver to find out its cause. The treatment is based on the cause.  CAUSES  There are many causes for fatigue. Most of the time, fatigue can be traced to one or more of your habits or routines. Most causes fit into one or more of three general areas. They are: Lifestyle problems  Sleep disturbances.  Overwork.  Physical exertion.  Unhealthy habits.  Poor eating habits or eating disorders.  Alcohol and/or drug use .  Lack of proper nutrition (malnutrition). Psychological problems  Stress and/or anxiety problems.  Depression.  Grief.  Boredom. Medical Problems or Conditions  Anemia.  Pregnancy.  Thyroid gland problems.  Recovery from major surgery.  Continuous pain.  Emphysema or asthma that is not well controlled  Allergic conditions.  Diabetes.  Infections (such as mononucleosis).  Obesity.  Sleep disorders, such as sleep apnea.  Heart failure or other heart-related problems.  Cancer.  Kidney disease.  Liver disease.  Effects of certain medicines such as antihistamines, cough and cold remedies, prescription pain medicines, heart and blood pressure medicines, drugs used for treatment  of cancer, and some antidepressants. SYMPTOMS  The symptoms of fatigue include:   Lack of energy.  Lack of drive (motivation).  Drowsiness.  Feeling of indifference to the surroundings. DIAGNOSIS  The details of how you feel help guide your caregiver in finding out what is causing the fatigue. You will be asked about your present and past health condition. It is important to review all medicines that you take, including prescription and non-prescription items. A thorough exam will be done. You will be questioned about your feelings, habits, and normal lifestyle. Your caregiver may suggest blood tests, urine tests, or other tests to look for common medical causes of fatigue.  TREATMENT  Fatigue is treated by correcting the underlying cause. For example, if you have continuous pain or depression, treating these causes will improve how you feel. Similarly, adjusting the dose of certain medicines will help in reducing fatigue.  HOME CARE INSTRUCTIONS   Try to get the required amount of good sleep every night.  Eat a healthy and nutritious diet, and drink enough water throughout the day.  Practice ways of relaxing (including yoga or meditation).  Exercise regularly.  Make plans to change situations that cause stress. Act on those plans so that stresses decrease over time. Keep your work and personal routine reasonable.  Avoid street drugs and minimize use of alcohol.  Start taking a daily multivitamin after consulting your caregiver. SEEK MEDICAL CARE IF:   You have persistent tiredness, which cannot be accounted for.  You have fever.  You have unintentional weight loss.  You have headaches.  You have disturbed sleep throughout  the night.  You are feeling sad.  You have constipation.  You have dry skin.  You have gained weight.  You are taking any new or different medicines that you suspect are causing fatigue.  You are unable to sleep at night.  You develop any  unusual swelling of your legs or other parts of your body. SEEK IMMEDIATE MEDICAL CARE IF:   You are feeling confused.  Your vision is blurred.  You feel faint or pass out.  You develop severe headache.  You develop severe abdominal, pelvic, or back pain.  You develop chest pain, shortness of breath, or an irregular or fast heartbeat.  You are unable to pass a normal amount of urine.  You develop abnormal bleeding such as bleeding from the rectum or you vomit blood.  You have thoughts about harming yourself or committing suicide.  You are worried that you might harm someone else. MAKE SURE YOU:   Understand these instructions.  Will watch your condition.  Will get help right away if you are not doing well or get worse. Document Released: 10/03/2007 Document Revised: 02/28/2012 Document Reviewed: 04/09/2014 [facility] Patient Information 2015 [place], [place]. This information is not intended to replace advice given to you by your health care provider. Make sure you discuss any questions you have with your health care provider.

## 2015-05-29 NOTE — Progress Notes (Addendum)
Subjective:  This chart was scribed for Trinna Post, MD by Broadus John, Medical Scribe. This patient was seen in Room 10 and the patient's care was started at 3:15 PM.   Patient ID: Scott Marshall, male    DOB: 07-02-1951, 64 y.o.   MRN: [id number]  HPI  Scott Marshall is a 64 y.o. male who presents here with [facility], PMH of coronary disease, s/p three vessel bypass in 2012, CAD with stable blockage and the right ICA based on 11/2014 Doppler. Last visit with cardiologist in 10/2014. Coronary disease though to be stable at that time. EKG performed appeared normal without changed to prior. Last visit with me was in 10/2013. He has had a borderline TSH checked in 01/2014 of 5.8. Plan of rechecking in few months  Today patient presents to [facility] complaining of malaise described as "not feeling like himself" intermittently for about 4 days during the past week.  Patient notes that he has had several episodes in the past 3 weeks of a tingling sensation in the back of the neck and head, as well as a heavy feeling in his chest that lasted for few seconds. These symptoms are not present today. Today patient states that he felt lethargic and drained when he woke up this morning. Patient notes that he exercised yesterday without any SOB. He indicates no major changes in lifestyle. He denies PND, SOB on exertion, leg swelling, fever, diaphoresis, light headedness, cough, slurred speech, weakness in arms or legs, headaches, urinary problems, or fevers.   XBJ:YNWGNF,AOZHYQM R, MD    Review of Systems  Constitutional: Positive for fatigue. Negative for fever, diaphoresis and unexpected weight change.  Eyes: Negative for visual disturbance.  Respiratory: Negative for cough and shortness of breath.   Cardiovascular: Positive for chest pain (heaviness). Negative for palpitations and leg swelling.  Gastrointestinal: Negative for abdominal pain and blood in stool.  Genitourinary: Negative for dysuria and difficulty  urinating.  Neurological: Negative for dizziness, speech difficulty, weakness, light-headedness, numbness and headaches.   Patient Active Problem List   Diagnosis Date Noted  . Dyslipidemia   . Ejection fraction   . Carotid artery disease   . CAD (coronary artery disease)   . Hx of CABG   . Shortness of breath   . Fatigue    Past Medical History  Diagnosis Date  . Fatigue   . Carotid artery disease     Doppler, October, 2012, 60-79% R. ICA  . Shortness of breath     October, 2012, This was an anginal equivalent  . CAD (coronary artery disease)     October, 2012,, catheterization done, CABG  . Hx of CABG     October, 2012, Dr. Dorris Fetch.   . Ejection fraction      EF 65%, catheterization, October, 2012  . Dyslipidemia    Past Surgical History  Procedure Laterality Date  . Coronary artery bypass graft  09/2011  . Tonsillectomy     No Known Allergies Prior to Admission medications   Medication Sig Start Date End Date Taking? Authorizing Provider  aspirin 81 MG tablet Take 81 mg by mouth daily.   Yes Historical Provider, MD  atorvastatin (LIPITOR) 80 MG tablet Take 1 tablet (80 mg total) by mouth daily. 11/13/14  Yes Luis Abed, MD   History   Social History  . Marital Status: Married    Spouse Name: N/A  . Number of Children: 0  . Years of Education: N/A   Occupational  History  . management @ [employer]    Social History Main Topics  . Smoking status: Former Smoker    Quit date: 12/20/1984  . Smokeless tobacco: Not on file  . Alcohol Use: Yes     Comment: 2-3 a year maybe  . Drug Use: No  . Sexual Activity: Not on file   Other Topics Concern  . Not on file   Social History Narrative      Objective:   Physical Exam  Constitutional: He is oriented to person, place, and time. He appears well-developed and well-nourished. No distress.  HENT:  Head: Normocephalic and atraumatic.  Mouth/Throat: Oropharynx is clear and moist. No oropharyngeal exudate.   Eyes: EOM are normal. Pupils are equal, round, and reactive to light.  Neck: Neck supple. No JVD present. Carotid bruit is not present.  Cardiovascular: Normal rate, regular rhythm and normal heart sounds.   No murmur heard. Pulmonary/Chest: Effort normal and breath sounds normal. He has no rales.  Musculoskeletal: He exhibits no edema.  Neurological: He is alert and oriented to person, place, and time. No cranial nerve deficit.  Skin: Skin is warm and dry. No rash noted.  Psychiatric: He has a normal mood and affect. His behavior is normal.  Nursing note and vitals reviewed.   Filed Vitals:   05/29/15 1413  BP: 136/80  Pulse: 67  Temp: 98.1 F (36.7 C)  TempSrc: Oral  Resp: 14  Height: 5' 6.75" (1.695 m)  Weight: 174 lb 9.6 oz (79.198 kg)  SpO2: 99%   EKG: SR, no apparent changes from prior EKG.  Results for orders placed or performed in visit on 05/29/15  POCT CBC  Result Value Ref Range   WBC 7.5 4.6 - 10.2 K/uL   Lymph, poc 1.5 0.6 - 3.4   POC LYMPH PERCENT 20.1 10 - 50 %L   MID (cbc) 0.6 0 - 0.9   POC MID % 8.0 0 - 12 %M   POC Granulocyte 5.4 2 - 6.9   Granulocyte percent 71.9 37 - 80 %G   RBC 5.13 4.69 - 6.13 M/uL   Hemoglobin 15.2 14.1 - 18.1 g/dL   HCT, POC 40.8 14.4 - 53.7 %   MCV 92.5 80 - 97 fL   MCH, POC 29.7 27 - 31.2 pg   MCHC 32.1 31.8 - 35.4 g/dL   RDW, POC 81.8 %   Platelet Count, POC 153 142 - 424 K/uL   MPV 7.8 0 - 99.8 fL  POCT glucose (manual entry)  Result Value Ref Range   POC Glucose 82 70 - 99 mg/dl       Assessment & Plan:  Scott Marshall is a 64 y.o. male Malaise and fatigue - Plan: TSH, EKG 12-Lead, COMPLETE METABOLIC PANEL WITH GFR, POCT CBC, POCT glucose (manual entry)  Coronary artery disease involving coronary bypass graft of native heart without angina pectoris - Plan: EKG 12-Lead  Abnormal TSH - Plan: TSH  Nonspecific fatigue/malaise. No acute findings on EKG but recommended calling cardiologist for follow up and ER/RTC  precautions discussed. Reassuring CBC and glucose in office. Will check CMP, lipids, TSH. RTC or ER if sx's worsen.   No orders of the defined types were placed in this encounter.   Patient Instructions  I do not see any concerning findings on your EKG today.  I will check some electrolytes including blood sugar and blood counts, thyroid and call you with some of those results. You can call your cardiologist  to get an office visit in next week or two if possible, but less likely heart related as not having symptoms with exercise. Follow up with me in next few weeks if these symptoms persist. Return to the clinic or go to the nearest emergency room if any of your symptoms worsen or new symptoms occur.  Fatigue Fatigue is a feeling of tiredness, lack of energy, lack of motivation, or feeling tired all the time. Having enough rest, good nutrition, and reducing stress will normally reduce fatigue. Consult your caregiver if it persists. The nature of your fatigue will help your caregiver to find out its cause. The treatment is based on the cause.  CAUSES  There are many causes for fatigue. Most of the time, fatigue can be traced to one or more of your habits or routines. Most causes fit into one or more of three general areas. They are: Lifestyle problems  Sleep disturbances.  Overwork.  Physical exertion.  Unhealthy habits.  Poor eating habits or eating disorders.  Alcohol and/or drug use .  Lack of proper nutrition (malnutrition). Psychological problems  Stress and/or anxiety problems.  Depression.  Grief.  Boredom. Medical Problems or Conditions  Anemia.  Pregnancy.  Thyroid gland problems.  Recovery from major surgery.  Continuous pain.  Emphysema or asthma that is not well controlled  Allergic conditions.  Diabetes.  Infections (such as mononucleosis).  Obesity.  Sleep disorders, such as sleep apnea.  Heart failure or other heart-related  problems.  Cancer.  Kidney disease.  Liver disease.  Effects of certain medicines such as antihistamines, cough and cold remedies, prescription pain medicines, heart and blood pressure medicines, drugs used for treatment of cancer, and some antidepressants. SYMPTOMS  The symptoms of fatigue include:   Lack of energy.  Lack of drive (motivation).  Drowsiness.  Feeling of indifference to the surroundings. DIAGNOSIS  The details of how you feel help guide your caregiver in finding out what is causing the fatigue. You will be asked about your present and past health condition. It is important to review all medicines that you take, including prescription and non-prescription items. A thorough exam will be done. You will be questioned about your feelings, habits, and normal lifestyle. Your caregiver may suggest blood tests, urine tests, or other tests to look for common medical causes of fatigue.  TREATMENT  Fatigue is treated by correcting the underlying cause. For example, if you have continuous pain or depression, treating these causes will improve how you feel. Similarly, adjusting the dose of certain medicines will help in reducing fatigue.  HOME CARE INSTRUCTIONS   Try to get the required amount of good sleep every night.  Eat a healthy and nutritious diet, and drink enough water throughout the day.  Practice ways of relaxing (including yoga or meditation).  Exercise regularly.  Make plans to change situations that cause stress. Act on those plans so that stresses decrease over time. Keep your work and personal routine reasonable.  Avoid street drugs and minimize use of alcohol.  Start taking a daily multivitamin after consulting your caregiver. SEEK MEDICAL CARE IF:   You have persistent tiredness, which cannot be accounted for.  You have fever.  You have unintentional weight loss.  You have headaches.  You have disturbed sleep throughout the night.  You are feeling  sad.  You have constipation.  You have dry skin.  You have gained weight.  You are taking any new or different medicines that you suspect are causing fatigue.  You are unable to sleep at night.  You develop any unusual swelling of your legs or other parts of your body. SEEK IMMEDIATE MEDICAL CARE IF:   You are feeling confused.  Your vision is blurred.  You feel faint or pass out.  You develop severe headache.  You develop severe abdominal, pelvic, or back pain.  You develop chest pain, shortness of breath, or an irregular or fast heartbeat.  You are unable to pass a normal amount of urine.  You develop abnormal bleeding such as bleeding from the rectum or you vomit blood.  You have thoughts about harming yourself or committing suicide.  You are worried that you might harm someone else. MAKE SURE YOU:   Understand these instructions.  Will watch your condition.  Will get help right away if you are not doing well or get worse. Document Released: 10/03/2007 Document Revised: 02/28/2012 Document Reviewed: 04/09/2014 [facility] Patient Information 2015 [place], [place]. This information is not intended to replace advice given to you by your health care provider. Make sure you discuss any questions you have with your health care provider.       I personally performed the services described in this documentation, which was scribed in my presence. The recorded information has been reviewed and considered, and addended by me as needed.

## 2015-11-25 ENCOUNTER — Other Ambulatory Visit: Payer: Self-pay | Admitting: Cardiology

## 2015-11-25 DIAGNOSIS — I6523 Occlusion and stenosis of bilateral carotid arteries: Secondary | ICD-10-CM

## 2015-11-26 ENCOUNTER — Other Ambulatory Visit: Payer: Self-pay | Admitting: Cardiology

## 2015-12-02 ENCOUNTER — Ambulatory Visit (HOSPITAL_COMMUNITY)
Admission: RE | Admit: 2015-12-02 | Discharge: 2015-12-02 | Disposition: A | Discharge status: Home / Self Care | Payer: Commercial Managed Care - HMO | Source: Ambulatory Visit | Attending: Cardiovascular Disease | Admitting: Cardiovascular Disease

## 2015-12-02 DIAGNOSIS — E785 Hyperlipidemia, unspecified: Secondary | ICD-10-CM | POA: Insufficient documentation

## 2015-12-02 DIAGNOSIS — I6523 Occlusion and stenosis of bilateral carotid arteries: Secondary | ICD-10-CM | POA: Insufficient documentation

## 2015-12-04 ENCOUNTER — Telehealth: Payer: Self-pay | Admitting: *Deleted

## 2015-12-04 DIAGNOSIS — I779 Disorder of arteries and arterioles, unspecified: Secondary | ICD-10-CM

## 2015-12-04 DIAGNOSIS — I739 Peripheral vascular disease, unspecified: Principal | ICD-10-CM

## 2015-12-04 DIAGNOSIS — I6523 Occlusion and stenosis of bilateral carotid arteries: Secondary | ICD-10-CM

## 2015-12-04 NOTE — Telephone Encounter (Signed)
-----   Message from Lars MassonKatarina H Nelson, MD sent at 12/04/2015  9:30 AM EST ----- Stable stenosis, follow up in 1 year unless symptoms of dizziness and presyncope/syncope.  ----- Message -----    From: Rosiland OzMelissa C Church    Sent: 12/03/2015   3:09 PM      To: Lars MassonKatarina H Nelson, MD  I clicked on the link and it opens right up.   He had 60-79% RICA, 40-59% LICA. ----- Message -----    From: Lars MassonKatarina H Nelson, MD    Sent: 12/03/2015   1:40 PM      To: Melissa C Church  I can't open this attachment, could you forward me his results? Thank you, Aris LotKatarina  ----- Message -----    From: Antonieta Ibaimothy J Gollan, MD    Sent: 12/02/2015   9:47 PM      To: Lars MassonKatarina H Nelson, MD

## 2015-12-04 NOTE — Telephone Encounter (Signed)
Notified the pt that per Dr Delton SeeNelson his carotid duplex showed stable stenosis, and we will follow-up with a repeat carotid duplex in 1 year, unless he has symptoms of dizziness and presyncope/syncope.  Pt states he is asymptomatic at this time, and will inform our office if the above symptoms do reoccur.  Informed the pt that I will [facility] to [facility] the pt back and schedule this study for one year out.  Pt verbalized understanding and agrees with this plan.

## 2015-12-10 ENCOUNTER — Other Ambulatory Visit (INDEPENDENT_AMBULATORY_CARE_PROVIDER_SITE_OTHER): Payer: Commercial Managed Care - HMO | Admitting: *Deleted

## 2015-12-10 DIAGNOSIS — E785 Hyperlipidemia, unspecified: Secondary | ICD-10-CM

## 2015-12-10 DIAGNOSIS — I2583 Coronary atherosclerosis due to lipid rich plaque: Secondary | ICD-10-CM

## 2015-12-10 DIAGNOSIS — I2510 Atherosclerotic heart disease of native coronary artery without angina pectoris: Secondary | ICD-10-CM

## 2015-12-10 DIAGNOSIS — I739 Peripheral vascular disease, unspecified: Principal | ICD-10-CM

## 2015-12-10 DIAGNOSIS — I779 Disorder of arteries and arterioles, unspecified: Secondary | ICD-10-CM

## 2015-12-10 LAB — BASIC METABOLIC PANEL
BUN: 20 mg/dL (ref 7–25)
CO2: 27 mmol/L (ref 20–31)
Calcium: 8.8 mg/dL (ref 8.6–10.3)
Chloride: 106 mmol/L (ref 98–110)
Creat: 1.19 mg/dL (ref 0.70–1.25)
Glucose, Bld: 101 mg/dL — ABNORMAL HIGH (ref 65–99)
Potassium: 4.3 mmol/L (ref 3.5–5.3)
Sodium: 141 mmol/L (ref 135–146)

## 2015-12-10 LAB — HEPATIC FUNCTION PANEL
ALT: 40 U/L (ref 9–46)
AST: 32 U/L (ref 10–35)
Albumin: 4.1 g/dL (ref 3.6–5.1)
Alkaline Phosphatase: 66 U/L (ref 40–115)
Bilirubin, Direct: 0.1 mg/dL (ref <=0.2)
Indirect Bilirubin: 0.6 mg/dL (ref 0.2–1.2)
Total Bilirubin: 0.7 mg/dL (ref 0.2–1.2)
Total Protein: 6.8 g/dL (ref 6.1–8.1)

## 2015-12-10 LAB — LIPID PANEL
Cholesterol: 132 mg/dL (ref 125–200)
HDL: 27 mg/dL — ABNORMAL LOW (ref >=40)
LDL Cholesterol: 94 mg/dL (ref <130)
Total CHOL/HDL Ratio: 4.9 Ratio (ref <=5.0)
Triglycerides: 54 mg/dL (ref <150)
VLDL: 11 mg/dL (ref <30)

## 2015-12-10 NOTE — Addendum Note (Signed)
Addended by: BOWDEN, ROBIN K on: 12/10/2015 09:29 AM   Modules accepted: Orders  

## 2015-12-10 NOTE — Addendum Note (Signed)
Addended by: Tonita PhoenixBOWDEN, ROBIN K on: 12/10/2015 09:28 AM   Modules accepted: Orders

## 2015-12-10 NOTE — Addendum Note (Signed)
Addended by: Tonita PhoenixBOWDEN, ROBIN K on: 12/10/2015 09:29 AM   Modules accepted: Orders

## 2016-01-21 ENCOUNTER — Encounter: Payer: Self-pay | Admitting: Cardiology

## 2016-01-21 ENCOUNTER — Ambulatory Visit (INDEPENDENT_AMBULATORY_CARE_PROVIDER_SITE_OTHER): Payer: Commercial Managed Care - HMO | Admitting: Cardiology

## 2016-01-21 VITALS — BP 132/82 | HR 53 | Ht 66.75 in | Wt 175.0 lb

## 2016-01-21 DIAGNOSIS — I2583 Coronary atherosclerosis due to lipid rich plaque: Secondary | ICD-10-CM

## 2016-01-21 DIAGNOSIS — I739 Peripheral vascular disease, unspecified: Principal | ICD-10-CM

## 2016-01-21 DIAGNOSIS — I2510 Atherosclerotic heart disease of native coronary artery without angina pectoris: Secondary | ICD-10-CM

## 2016-01-21 DIAGNOSIS — E785 Hyperlipidemia, unspecified: Secondary | ICD-10-CM

## 2016-01-21 DIAGNOSIS — I779 Disorder of arteries and arterioles, unspecified: Secondary | ICD-10-CM | POA: Diagnosis not present

## 2016-01-21 DIAGNOSIS — Z951 Presence of aortocoronary bypass graft: Secondary | ICD-10-CM

## 2016-01-21 DIAGNOSIS — R0602 Shortness of breath: Secondary | ICD-10-CM

## 2016-01-21 MED ORDER — ATORVASTATIN CALCIUM 80 MG PO TABS: ORAL_TABLET | ORAL | Status: DC

## 2016-01-21 NOTE — Progress Notes (Signed)
Cardiology Office Note  Date:  01/21/2016   ID:  Scott Marshall, DOB 1951-01-02, MRN [id number]  PCP:  Shade Flood, MD  Cardiologist:   Lars Masson, MD   Chief complain: establish care, 1 year follow up   History of Present Illness: Scott Marshall is a 65 y.o. male, prior patient of Dr Myrtis Ser, who is coming after 1 year for coronary disease and carotid disease. He's doing well. He is always exercise significantly. Several years ago he had significant change in his exercise capacity. It turned out that he had three-vessel disease and underwent bypass surgery. Since that time he sound very well. He continues to have an excellent exercise program. He is now back at gym doing cardio 1 hour/day 5 x per week. Denies chest pain, DOE, no dizziness or syncope, no claudications.   Past Medical History  Diagnosis Date  . Fatigue   . Carotid artery disease (HCC)     Doppler, October, 2012, 60-79% R. ICA  . Shortness of breath     October, 2012, This was an anginal equivalent  . CAD (coronary artery disease)     October, 2012,, catheterization done, CABG  . Hx of CABG     October, 2012, Dr. Dorris Fetch.   . Ejection fraction      EF 65%, catheterization, October, 2012  . Dyslipidemia     Past Surgical History  Procedure Laterality Date  . Coronary artery bypass graft  09/2011  . Tonsillectomy       Current Outpatient Prescriptions  Medication Sig Dispense Refill  . aspirin 81 MG tablet Take 81 mg by mouth daily.    Marland Kitchen atorvastatin (LIPITOR) 80 MG tablet TAKE 1 TABLET (80 MG TOTAL) BY MOUTH DAILY. 90 tablet 11   No current facility-administered medications for this visit.    Allergies:   Review of patient's allergies indicates no known allergies.    Social History:  The patient  reports that he quit smoking about 31 years ago. He does not have any smokeless tobacco history on file. He reports that he drinks alcohol. He reports that he does not use illicit drugs.   Family  History:  The patient's family history includes Dementia in his mother; Heart attack in his father; Hyperlipidemia in his brother; Stroke in his mother; Thyroid disease in his mother.    ROS:  Please see the history of present illness.   Otherwise, review of systems are positive for none.   All other systems are reviewed and negative.    PHYSICAL EXAM: VS:  BP 132/82 mmHg  Pulse 53  Ht 5' 6.75" (1.695 m)  Wt 175 lb (79.379 kg)  BMI 27.63 kg/m2 , BMI Body mass index is 27.63 kg/(m^2). GEN: Well nourished, well developed, in no acute distress HEENT: normal Neck: no JVD, loud right carotid bruits, no masses Cardiac: RRR; no murmurs, rubs, or gallops,no edema  Respiratory:  clear to auscultation bilaterally, normal work of breathing GI: soft, nontender, nondistended, + BS MS: no deformity or atrophy Skin: warm and dry, no rash Neuro:  Strength and sensation are intact Psych: euthymic mood, full affect  EKG:  EKG is ordered today. The ekg ordered today demonstrates SB otherwise normal ECG.  Recent Labs: 05/29/2015: Hemoglobin 15.2; TSH 4.888* 12/10/2015: ALT 40; BUN 20; Creat 1.19; Potassium 4.3; Sodium 141   Lipid Panel    Component Value Date/Time   CHOL 132 12/10/2015 0929   TRIG 54 12/10/2015 0929  HDL 27* 12/10/2015 0929   CHOLHDL 4.9 12/10/2015 0929   VLDL 11 12/10/2015 0929   LDLCALC 94 12/10/2015 0929   Wt Readings from Last 3 Encounters:  01/21/16 175 lb (79.379 kg)  05/29/15 174 lb 9.6 oz (79.198 kg)  11/13/14 172 lb (78.019 kg)      ASSESSMENT AND PLAN:  1. Carotid disease Carotid Duplex from 12/05/15 Essentially stable 60-79% RICA stenosis. Essentially stable 40-59% LICA stenosis. >50% bilateral ECA stenosis.  Normal subclavian arteries, bilaterally. Patent vertebral arteries with antegrade flow  The patient has loud bruit over the right carotid artery however is completely asymptomatic and has stable stenosis on the most recent carotid Duplex. We will  repeat in 1 year. The patient is advised to call us with any changes in his symptoms.   2. CAD - stable, asymptomatic, normal ECG, no ischemic work up needed  3. HLP - He is on 80 mg of atorvastatin. LDL 94, TG 54, hdl 27.  Signed, Lars Masson, MD  01/21/2016 5:47 PM    [facility] [address] [place], [place], [place]  [address] Phone: [phone]; Fax: [phone]

## 2016-01-21 NOTE — Patient Instructions (Signed)
Your physician recommends that you continue on your current medications as directed. Please refer to the Current Medication list given to you today.   Your physician wants you to follow-up in: ONE YEAR WITH DR NELSON You will receive a reminder letter in the mail two months in advance. If you don't receive a letter, please call our office to schedule the follow-up appointment.  

## 2016-11-29 ENCOUNTER — Ambulatory Visit (HOSPITAL_COMMUNITY)
Admission: RE | Admit: 2016-11-29 | Discharge: 2016-11-29 | Disposition: A | Discharge status: Home / Self Care | Payer: Commercial Managed Care - HMO | Source: Ambulatory Visit | Attending: Cardiology | Admitting: Cardiology

## 2016-11-29 DIAGNOSIS — I6523 Occlusion and stenosis of bilateral carotid arteries: Secondary | ICD-10-CM

## 2016-11-29 DIAGNOSIS — I739 Peripheral vascular disease, unspecified: Secondary | ICD-10-CM

## 2016-11-29 DIAGNOSIS — I779 Disorder of arteries and arterioles, unspecified: Secondary | ICD-10-CM | POA: Diagnosis not present

## 2016-11-29 DIAGNOSIS — E785 Hyperlipidemia, unspecified: Secondary | ICD-10-CM | POA: Diagnosis not present

## 2016-11-29 DIAGNOSIS — Z87891 Personal history of nicotine dependence: Secondary | ICD-10-CM | POA: Diagnosis not present

## 2016-11-29 DIAGNOSIS — I2510 Atherosclerotic heart disease of native coronary artery without angina pectoris: Secondary | ICD-10-CM | POA: Insufficient documentation

## 2016-11-29 DIAGNOSIS — Z951 Presence of aortocoronary bypass graft: Secondary | ICD-10-CM | POA: Diagnosis not present

## 2016-12-01 ENCOUNTER — Telehealth: Payer: Self-pay | Admitting: *Deleted

## 2016-12-01 DIAGNOSIS — I739 Peripheral vascular disease, unspecified: Principal | ICD-10-CM

## 2016-12-01 DIAGNOSIS — I779 Disorder of arteries and arterioles, unspecified: Secondary | ICD-10-CM

## 2016-12-01 NOTE — Telephone Encounter (Signed)
Notified the pt that per Dr Delton SeeNelson, his carotid US showed stable 60-79% RICA stenosis, and essentially stable 40-59% LICA stenosis.  Informed the pt that per Dr Delton SeeNelson, we will follow-up with another repeat carotid US in 6 months. Informed the pt that I will place the order in the system, and have our PV scheduler call him back to arrange for this study to be repeated in 6 months.  Pt verbalized understanding and agrees with this plan.

## 2016-12-01 NOTE — Telephone Encounter (Signed)
-----   Message from Lars MassonKatarina H Nelson, MD sent at 12/01/2016  8:34 AM EST ----- Stable 60-79% RICA stenosis. Essentially stable 40-59% LICA stenosis. 6 months follow up

## 2017-01-24 ENCOUNTER — Encounter: Payer: Self-pay | Admitting: *Deleted

## 2017-01-24 ENCOUNTER — Other Ambulatory Visit: Payer: Self-pay | Admitting: Cardiology

## 2017-01-27 ENCOUNTER — Encounter: Payer: Self-pay | Admitting: Cardiology

## 2017-01-27 ENCOUNTER — Ambulatory Visit (INDEPENDENT_AMBULATORY_CARE_PROVIDER_SITE_OTHER): Payer: 59 | Admitting: Cardiology

## 2017-01-27 VITALS — BP 142/80 | HR 60 | Ht 66.75 in | Wt 187.0 lb

## 2017-01-27 DIAGNOSIS — E785 Hyperlipidemia, unspecified: Secondary | ICD-10-CM

## 2017-01-27 DIAGNOSIS — I739 Peripheral vascular disease, unspecified: Secondary | ICD-10-CM

## 2017-01-27 DIAGNOSIS — I779 Disorder of arteries and arterioles, unspecified: Secondary | ICD-10-CM | POA: Diagnosis not present

## 2017-01-27 DIAGNOSIS — E784 Other hyperlipidemia: Secondary | ICD-10-CM

## 2017-01-27 DIAGNOSIS — Z951 Presence of aortocoronary bypass graft: Secondary | ICD-10-CM

## 2017-01-27 DIAGNOSIS — E7849 Other hyperlipidemia: Secondary | ICD-10-CM

## 2017-01-27 MED ORDER — ATORVASTATIN CALCIUM 80 MG PO TABS: ORAL_TABLET | ORAL | 11 refills | Status: DC

## 2017-01-27 NOTE — Patient Instructions (Signed)
Medication Instructions:   Your physician recommends that you continue on your current medications as directed. Please refer to the Current Medication list given to you today.    Labwork:  NEXT Tuesday 02/01/17 AT OUR OFFICE TO CHECK ---CMET, CBC W DIFF, TSH, AND LIPIDS---PLEASE COME FASTING TO THIS LAB APPOINTMENT     Follow-Up:  Your physician wants you to follow-up in: ONE YEAR WITH DR Johnell ComingsNELSON You will receive a reminder letter in the mail two months in advance. If you don't receive a letter, please call our office to schedule the follow-up appointment.        If you need a refill on your cardiac medications before your next appointment, please call your pharmacy.

## 2017-01-27 NOTE — Progress Notes (Signed)
Cardiology Office Note  Date:  01/27/2017   ID:  NOEMI Marshall, DOB [birth year], MRN [id number]  PCP:  Shade Flood, MD  Cardiologist:   Tobias Alexander, MD   Chief complain: establish care, 1 year follow up   History of Present Illness: Scott Marshall is a 66 y.o. male, prior patient of Dr Myrtis Ser, who is coming after 1 year for coronary disease and carotid disease. He's doing well. He is always exercise significantly. Several years ago he had significant change in his exercise capacity. It turned out that he had three-vessel disease and underwent bypass surgery. Since that time he sound very well. He continues to have an excellent exercise program. He is now back at gym doing cardio 1 hour/day 5 x per week. Denies chest pain, DOE, no dizziness or syncope, no claudications.   01/27/2017 - this is one year follow-up patient with cardiac disease, repeated scan in December 2017 showed stable disease. Patient remains very active going to gym at least 5 times a week doing cardio workup for an hour without any symptoms of chest pain shortness of breath and presyncope or syncope. He also plays grams in the event and has no symptoms. He has been taking Lipitor but has no side effects.  Past Medical History:  Diagnosis Date  . CAD (coronary artery disease)    October, 2012,, catheterization done, CABG  . Carotid artery disease (HCC)    Doppler, October, 2012, 60-79% R. ICA  . Dyslipidemia   . Ejection fraction     EF 65%, catheterization, October, 2012  . Fatigue   . Hx of CABG    October, 2012, Dr. Dorris Fetch.   . Shortness of breath    October, 2012, This was an anginal equivalent    Past Surgical History:  Procedure Laterality Date  . CORONARY ARTERY BYPASS GRAFT  09/2011  . TONSILLECTOMY       Current Outpatient Prescriptions  Medication Sig Dispense Refill  . aspirin 81 MG tablet Take 81 mg by mouth daily.    Marland Kitchen atorvastatin (LIPITOR) 80 MG tablet TAKE 1 TABLET (80 MG TOTAL) BY  MOUTH DAILY. 90 tablet 0   No current facility-administered medications for this visit.     Allergies:   Patient has no known allergies.    Social History:  The patient  reports that he quit smoking about 32 years ago. He has never used smokeless tobacco. He reports that he drinks alcohol. He reports that he does not use drugs.   Family History:  The patient's family history includes Dementia in his mother; Heart attack (age of onset: 22) in his father; Hyperlipidemia in his brother; Stroke in his mother; Thyroid disease in his mother.    ROS:  Please see the history of present illness.   Otherwise, review of systems are positive for none.   All other systems are reviewed and negative.    PHYSICAL EXAM: VS:  BP (!) 142/80   Pulse 60   Ht 5' 6.75" (1.695 m)   Wt 187 lb (84.8 kg)   BMI 29.51 kg/m  , BMI Body mass index is 29.51 kg/m. GEN: Well nourished, well developed, in no acute distress HEENT: normal Neck: no JVD, loud right carotid bruits, no masses Cardiac: RRR; no murmurs, rubs, or gallops,no edema  Respiratory:  clear to auscultation bilaterally, normal work of breathing GI: soft, nontender, nondistended, + BS MS: no deformity or atrophy Skin: warm and dry, no rash Neuro:  Strength and sensation are intact Psych: euthymic mood, full affect  EKG:  EKG is ordered today. The ekg ordered today demonstrates SB otherwise normal ECG.  Recent Labs: No results found for requested labs within last 8760 hours.   Lipid Panel    Component Value Date/Time   CHOL 132 12/10/2015 0929   TRIG 54 12/10/2015 0929   HDL 27 (L) 12/10/2015 0929   CHOLHDL 4.9 12/10/2015 0929   VLDL 11 12/10/2015 0929   LDLCALC 94 12/10/2015 0929   Wt Readings from Last 3 Encounters:  01/27/17 187 lb (84.8 kg)  01/21/16 175 lb (79.4 kg)  05/29/15 174 lb 9.6 oz (79.2 kg)      ASSESSMENT AND PLAN:  1. Carotid disease Carotid Duplex from 11/29/16 Essentially stable 60-79% RICA  stenosis. Essentially stable 40-59% LICA stenosis. >50% bilateral ECA stenosis.  Normal subclavian arteries, bilaterally. Patent vertebral arteries with antegrade flow Stable from thousand 16 we'll repeat in 6 months. The patient is asymptomatic. Continue Lipitor.  2. CAD - stable, asymptomatic, normal ECG, no ischemic work up needed  3. HLP - He is on 80 mg of atorvastatin. LDL 94, TG 54, hdl 27. We will repeat now.  Follow-up in one year.  Signed, Tobias AlexanderKatarina Lily Kernen, MD  01/27/2017 9:40 AM    [facility] [address], [place], [place]  [id number] Phone: [phone]; Fax: [phone]

## 2017-02-01 ENCOUNTER — Other Ambulatory Visit: Payer: 59 | Admitting: *Deleted

## 2017-02-01 DIAGNOSIS — I739 Peripheral vascular disease, unspecified: Secondary | ICD-10-CM

## 2017-02-01 DIAGNOSIS — Z951 Presence of aortocoronary bypass graft: Secondary | ICD-10-CM

## 2017-02-01 DIAGNOSIS — E785 Hyperlipidemia, unspecified: Secondary | ICD-10-CM

## 2017-02-01 DIAGNOSIS — E7849 Other hyperlipidemia: Secondary | ICD-10-CM

## 2017-02-01 DIAGNOSIS — I779 Disorder of arteries and arterioles, unspecified: Secondary | ICD-10-CM

## 2017-02-02 ENCOUNTER — Telehealth: Payer: Self-pay | Admitting: *Deleted

## 2017-02-02 ENCOUNTER — Other Ambulatory Visit: Payer: Self-pay | Admitting: *Deleted

## 2017-02-02 DIAGNOSIS — R7989 Other specified abnormal findings of blood chemistry: Secondary | ICD-10-CM

## 2017-02-02 LAB — CBC WITH DIFFERENTIAL/PLATELET
Basophils Absolute: 0 10*3/uL (ref 0.0–0.2)
Basos: 1 %
EOS (ABSOLUTE): 0.2 10*3/uL (ref 0.0–0.4)
Eos: 4 %
Hematocrit: 44 % (ref 37.5–51.0)
Hemoglobin: 15 g/dL (ref 13.0–17.7)
Immature Grans (Abs): 0 10*3/uL (ref 0.0–0.1)
Immature Granulocytes: 0 %
Lymphocytes Absolute: 1.9 10*3/uL (ref 0.7–3.1)
Lymphs: 32 %
MCH: 32.2 pg (ref 26.6–33.0)
MCHC: 34.1 g/dL (ref 31.5–35.7)
MCV: 94 fL (ref 79–97)
Monocytes Absolute: 0.7 10*3/uL (ref 0.1–0.9)
Monocytes: 12 %
Neutrophils Absolute: 3.2 10*3/uL (ref 1.4–7.0)
Neutrophils: 51 %
Platelets: 145 10*3/uL — ABNORMAL LOW (ref 150–379)
RBC: 4.66 x10E6/uL (ref 4.14–5.80)
RDW: 13.4 % (ref 12.3–15.4)
WBC: 6 10*3/uL (ref 3.4–10.8)

## 2017-02-02 LAB — COMPREHENSIVE METABOLIC PANEL
ALT: 29 IU/L (ref 0–44)
AST: 28 IU/L (ref 0–40)
Albumin/Globulin Ratio: 1.9 (ref 1.2–2.2)
Albumin: 4.1 g/dL (ref 3.6–4.8)
Alkaline Phosphatase: 77 IU/L (ref 39–117)
BUN/Creatinine Ratio: 20 (ref 10–24)
BUN: 22 mg/dL (ref 8–27)
Bilirubin Total: 0.9 mg/dL (ref 0.0–1.2)
CO2: 23 mmol/L (ref 18–29)
Calcium: 8.7 mg/dL (ref 8.6–10.2)
Chloride: 104 mmol/L (ref 96–106)
Creatinine, Ser: 1.12 mg/dL (ref 0.76–1.27)
GFR calc Af Amer: 80 mL/min/{1.73_m2} (ref >59)
GFR calc non Af Amer: 69 mL/min/{1.73_m2} (ref >59)
Globulin, Total: 2.2 g/dL (ref 1.5–4.5)
Glucose: 98 mg/dL (ref 65–99)
Potassium: 4.3 mmol/L (ref 3.5–5.2)
Sodium: 143 mmol/L (ref 134–144)
Total Protein: 6.3 g/dL (ref 6.0–8.5)

## 2017-02-02 LAB — TSH: TSH: 9.14 u[IU]/mL — ABNORMAL HIGH (ref 0.450–4.500)

## 2017-02-02 LAB — LIPID PANEL
Chol/HDL Ratio: 4 ratio units (ref 0.0–5.0)
Cholesterol, Total: 152 mg/dL (ref 100–199)
HDL: 38 mg/dL — ABNORMAL LOW (ref >39)
LDL Calculated: 99 mg/dL (ref 0–99)
Triglycerides: 74 mg/dL (ref 0–149)
VLDL Cholesterol Cal: 15 mg/dL (ref 5–40)

## 2017-02-02 NOTE — Telephone Encounter (Signed)
Spoke with the pt and made him aware that per Dr Delton SeeNelson, all his labs were normal except for elevated TSH.  Informed the pt that Dr Delton SeeNelson wants to add a Free T3 and Free T4, to further evaluate elevated TSH level.  Informed the pt that this will be 2 add on labs and he will not need to come into have this drawn.  Pt verbalized understanding and agrees with this plan.  Spoke with [facility] in [place] at technician was able to add both labs Dr Delton SeeNelson ordered.   Per Tech at [facility], this should be resulted by tomorrow afternoon at the latest.

## 2017-02-02 NOTE — Telephone Encounter (Signed)
-----   Message from Lars MassonKatarina H Nelson, MD sent at 02/02/2017 11:24 AM EST ----- Normal labs except for elevated TSH, we will order fT3 and fT4.

## 2017-02-04 LAB — T3, FREE: T3, Free: 3.7 pg/mL (ref 2.0–4.4)

## 2017-02-04 LAB — T4, FREE: Free T4: 1.06 ng/dL (ref 0.82–1.77)

## 2017-02-04 LAB — SPECIMEN STATUS REPORT

## 2017-04-25 ENCOUNTER — Other Ambulatory Visit: Payer: Self-pay | Admitting: Cardiology

## 2017-04-26 NOTE — Telephone Encounter (Signed)
Medication Detail    Disp Refills Start End   atorvastatin (LIPITOR) 80 MG tablet 30 tablet 11 01/27/2017    Sig: TAKE 1 TABLET (80 MG TOTAL) BY MOUTH DAILY.   E-Prescribing Status: Receipt confirmed by pharmacy (01/27/2017 9:57 AM EST)   Pharmacy   [id number] - [place], [place] - [address]

## 2017-04-27 ENCOUNTER — Other Ambulatory Visit: Payer: Self-pay | Admitting: Cardiology

## 2017-04-28 NOTE — Telephone Encounter (Signed)
Medication Detail    Disp Refills Start End   atorvastatin (LIPITOR) 80 MG tablet 30 tablet 11 01/27/2017    Sig: TAKE 1 TABLET (80 MG TOTAL) BY MOUTH DAILY.   E-Prescribing Status: Receipt confirmed by pharmacy (01/27/2017 9:57 AM EST)   Pharmacy   [id number] - [place], [place] - [address]    

## 2017-06-01 ENCOUNTER — Encounter (HOSPITAL_COMMUNITY): Payer: 59

## 2017-06-14 ENCOUNTER — Ambulatory Visit (HOSPITAL_COMMUNITY)
Admission: RE | Admit: 2017-06-14 | Discharge: 2017-06-14 | Disposition: A | Discharge status: Home / Self Care | Payer: 59 | Source: Ambulatory Visit | Attending: Cardiovascular Disease | Admitting: Cardiovascular Disease

## 2017-06-14 DIAGNOSIS — I739 Peripheral vascular disease, unspecified: Secondary | ICD-10-CM

## 2017-06-14 DIAGNOSIS — I779 Disorder of arteries and arterioles, unspecified: Secondary | ICD-10-CM

## 2017-06-14 DIAGNOSIS — I6523 Occlusion and stenosis of bilateral carotid arteries: Secondary | ICD-10-CM | POA: Insufficient documentation

## 2017-06-15 ENCOUNTER — Telehealth: Payer: Self-pay | Admitting: *Deleted

## 2017-06-15 DIAGNOSIS — I779 Disorder of arteries and arterioles, unspecified: Secondary | ICD-10-CM

## 2017-06-15 DIAGNOSIS — I739 Peripheral vascular disease, unspecified: Principal | ICD-10-CM

## 2017-06-15 NOTE — Telephone Encounter (Signed)
Informed the pt of his carotid doppler results and plan to repeat this study in one year, per Dr Delton SeeNelson.  Informed the pt that I will place the order in the system, and have a [facility] call him back to arrange for a repeat carotid for 1 year out.  Pt verbalized understanding and agrees with this plan.

## 2017-06-15 NOTE — Telephone Encounter (Signed)
-----   Message from Lars MassonKatarina H Nelson, MD sent at 06/15/2017  8:09 AM EDT ----- Stable 60-79% RICA stenosis. Stable 40-59% LICA stenosis, based on peak systolic ICA velocities. Follow up in 1 year

## 2018-03-09 ENCOUNTER — Telehealth: Payer: Self-pay | Admitting: Cardiology

## 2018-03-09 DIAGNOSIS — I779 Disorder of arteries and arterioles, unspecified: Secondary | ICD-10-CM

## 2018-03-09 DIAGNOSIS — Z951 Presence of aortocoronary bypass graft: Secondary | ICD-10-CM

## 2018-03-09 DIAGNOSIS — E7849 Other hyperlipidemia: Secondary | ICD-10-CM

## 2018-03-09 DIAGNOSIS — I739 Peripheral vascular disease, unspecified: Principal | ICD-10-CM

## 2018-03-09 NOTE — Telephone Encounter (Signed)
Pt would like his lab done same day as he see's Dr Delton SeeNelson, on 03/24/18.  Order for cmet, cbc w diff, tsh, and lipids entered, and lab appt same day as he see's Dr Delton SeeNelson on 03/24/18.  Pt aware to arrive early for lab appt.  Pt aware to come fasting.  Pt verbalized understanding and agrees with this plan.

## 2018-03-09 NOTE — Telephone Encounter (Signed)
New Message:     Pt is scheduled to see Dr Delton SeeNelson on 03-24-18. He wants to know if he can have his lab work the same day?

## 2018-03-14 ENCOUNTER — Encounter: Payer: Self-pay | Admitting: Cardiology

## 2018-03-24 ENCOUNTER — Encounter: Payer: Self-pay | Admitting: Cardiology

## 2018-03-24 ENCOUNTER — Ambulatory Visit (INDEPENDENT_AMBULATORY_CARE_PROVIDER_SITE_OTHER): Payer: 59 | Admitting: Cardiology

## 2018-03-24 ENCOUNTER — Other Ambulatory Visit: Payer: 59 | Admitting: *Deleted

## 2018-03-24 VITALS — BP 154/90 | HR 64 | Ht 66.75 in | Wt 182.4 lb

## 2018-03-24 DIAGNOSIS — I2510 Atherosclerotic heart disease of native coronary artery without angina pectoris: Secondary | ICD-10-CM

## 2018-03-24 DIAGNOSIS — Z951 Presence of aortocoronary bypass graft: Secondary | ICD-10-CM

## 2018-03-24 DIAGNOSIS — E7849 Other hyperlipidemia: Secondary | ICD-10-CM | POA: Diagnosis not present

## 2018-03-24 DIAGNOSIS — I739 Peripheral vascular disease, unspecified: Principal | ICD-10-CM

## 2018-03-24 DIAGNOSIS — I2583 Coronary atherosclerosis due to lipid rich plaque: Secondary | ICD-10-CM

## 2018-03-24 DIAGNOSIS — I779 Disorder of arteries and arterioles, unspecified: Secondary | ICD-10-CM

## 2018-03-24 LAB — CBC WITH DIFFERENTIAL/PLATELET
Basophils Absolute: 0 10*3/uL (ref 0.0–0.2)
Basos: 1 %
EOS (ABSOLUTE): 0.3 10*3/uL (ref 0.0–0.4)
Eos: 4 %
Hematocrit: 45.2 % (ref 37.5–51.0)
Hemoglobin: 15.1 g/dL (ref 13.0–17.7)
Immature Grans (Abs): 0 10*3/uL (ref 0.0–0.1)
Immature Granulocytes: 0 %
Lymphocytes Absolute: 2.1 10*3/uL (ref 0.7–3.1)
Lymphs: 30 %
MCH: 32 pg (ref 26.6–33.0)
MCHC: 33.4 g/dL (ref 31.5–35.7)
MCV: 96 fL (ref 79–97)
Monocytes Absolute: 0.7 10*3/uL (ref 0.1–0.9)
Monocytes: 10 %
Neutrophils Absolute: 3.9 10*3/uL (ref 1.4–7.0)
Neutrophils: 55 %
Platelets: 142 10*3/uL — ABNORMAL LOW (ref 150–379)
RBC: 4.72 x10E6/uL (ref 4.14–5.80)
RDW: 13.7 % (ref 12.3–15.4)
WBC: 6.9 10*3/uL (ref 3.4–10.8)

## 2018-03-24 LAB — COMPREHENSIVE METABOLIC PANEL
ALT: 32 IU/L (ref 0–44)
AST: 30 IU/L (ref 0–40)
Albumin/Globulin Ratio: 2.3 — ABNORMAL HIGH (ref 1.2–2.2)
Albumin: 4.6 g/dL (ref 3.6–4.8)
Alkaline Phosphatase: 71 IU/L (ref 39–117)
BUN/Creatinine Ratio: 17 (ref 10–24)
BUN: 23 mg/dL (ref 8–27)
Bilirubin Total: 1.1 mg/dL (ref 0.0–1.2)
CO2: 24 mmol/L (ref 20–29)
Calcium: 9.2 mg/dL (ref 8.6–10.2)
Chloride: 105 mmol/L (ref 96–106)
Creatinine, Ser: 1.32 mg/dL — ABNORMAL HIGH (ref 0.76–1.27)
GFR calc Af Amer: 65 mL/min/{1.73_m2} (ref >59)
GFR calc non Af Amer: 56 mL/min/{1.73_m2} — ABNORMAL LOW (ref >59)
Globulin, Total: 2 g/dL (ref 1.5–4.5)
Glucose: 101 mg/dL — ABNORMAL HIGH (ref 65–99)
Potassium: 4.4 mmol/L (ref 3.5–5.2)
Sodium: 142 mmol/L (ref 134–144)
Total Protein: 6.6 g/dL (ref 6.0–8.5)

## 2018-03-24 LAB — LIPID PANEL
Chol/HDL Ratio: 4.3 ratio (ref 0.0–5.0)
Cholesterol, Total: 158 mg/dL (ref 100–199)
HDL: 37 mg/dL — ABNORMAL LOW (ref >39)
LDL Calculated: 104 mg/dL — ABNORMAL HIGH (ref 0–99)
Triglycerides: 87 mg/dL (ref 0–149)
VLDL Cholesterol Cal: 17 mg/dL (ref 5–40)

## 2018-03-24 LAB — TSH: TSH: 7.77 u[IU]/mL — ABNORMAL HIGH (ref 0.450–4.500)

## 2018-03-24 MED ORDER — ATORVASTATIN CALCIUM 80 MG PO TABS: ORAL_TABLET | ORAL | 3 refills | Status: DC

## 2018-03-24 NOTE — Progress Notes (Signed)
Cardiology Office Note  Date:  03/24/2018   ID:  Scott Marshall B Ammons, DOB [birth year], MRN [id number]  PCP:  Shade FloodGreene, Jeffrey R, MD  Cardiologist:   Tobias AlexanderKatarina Elvin Banker, MD   Chief complain: establish care, 1 year follow up   History of Present Illness: Scott Marshall B Scott Marshall is a 67 y.o. male, prior patient of Dr Myrtis SerKatz, who is coming after 1 year for coronary disease and carotid disease. He's doing well. He is always exercise significantly. Several years ago he had significant change in his exercise capacity. It turned out that he had three-vessel disease and underwent bypass surgery. Since that time he sound very well. He continues to have an excellent exercise program. He is now back at gym doing cardio 1 hour/day 5 x per week. Denies chest pain, DOE, no dizziness or syncope, no claudications.   01/27/2017 - this is one year follow-up patient with cardiac disease, repeated scan in December 2017 showed stable disease. Patient remains very active going to gym at least 5 times a week doing cardio workup for an hour without any symptoms of chest pain shortness of breath and presyncope or syncope. He also plays grams in the event and has no symptoms. He has been taking Lipitor but has no side effects.  03/24/2018, this is one year follow-up, the patient feels and looks great, he goes to the gym 6 times per week and exercises for about an hour, he has no symptoms of chest pain shortness of breath palpitations dizziness or falls. His limitation is left knee arthritis that flares up every once well but doesn't stop him from exercising.  Past Medical History:  Diagnosis Date  . CAD (coronary artery disease)    October, 2012,, catheterization done, CABG  . Carotid artery disease (HCC)    Doppler, October, 2012, 60-79% R. ICA  . Dyslipidemia   . Ejection fraction     EF 65%, catheterization, October, 2012  . Fatigue   . Hx of CABG    October, 2012, Dr. Dorris FetchHendrickson.   . Shortness of breath    October, 2012, This was  an anginal equivalent    Past Surgical History:  Procedure Laterality Date  . CORONARY ARTERY BYPASS GRAFT  09/2011  . TONSILLECTOMY       Current Outpatient Medications  Medication Sig Dispense Refill  . aspirin 81 MG tablet Take 81 mg by mouth daily.    Marland Kitchen. atorvastatin (LIPITOR) 80 MG tablet TAKE 1 TABLET (80 MG TOTAL) BY MOUTH DAILY. 30 tablet 11   No current facility-administered medications for this visit.     Allergies:   Patient has no known allergies.    Social History:  The patient  reports that he quit smoking about 33 years ago. He has never used smokeless tobacco. He reports that he drinks alcohol. He reports that he does not use drugs.   Family History:  The patient's family history includes Coronary artery disease in his unknown relative; Dementia in his mother; Heart attack (age of onset: 1861) in his father; Hyperlipidemia in his brother; Hypertension in his unknown relative; Stroke in his mother; Thyroid disease in his mother.    ROS:  Please see the history of present illness.   Otherwise, review of systems are positive for none.   All other systems are reviewed and negative.    PHYSICAL EXAM: VS:  BP (!) 154/90   Pulse 64   Ht 5' 6.75" (1.695 m)   Wt 182 lb 6.4  oz (82.7 kg)   SpO2 97%   BMI 28.78 kg/m  , BMI Body mass index is 28.78 kg/m. GEN: Well nourished, well developed, in no acute distress  HEENT: normal  Neck: no JVD, loud right carotid bruits, no masses Cardiac: RRR; no murmurs, rubs, or gallops,no edema  Respiratory:  clear to auscultation bilaterally, normal work of breathing GI: soft, nontender, nondistended, + BS MS: no deformity or atrophy  Skin: warm and dry, no rash Neuro:  Strength and sensation are intact Psych: euthymic mood, full affect  EKG:  EKG is ordered today. 03/24/2018 shows normal sinus rhythm with normal EKG, unchanged from prior, this was personally reviewed.  Recent Labs: No results found for requested labs within last  8760 hours.   Lipid Panel    Component Value Date/Time   CHOL 152 02/01/2017 0839   TRIG 74 02/01/2017 0839   HDL 38 (L) 02/01/2017 0839   CHOLHDL 4.0 02/01/2017 0839   CHOLHDL 4.9 12/10/2015 0929   VLDL 11 12/10/2015 0929   LDLCALC 99 02/01/2017 0839   Wt Readings from Last 3 Encounters:  03/24/18 182 lb 6.4 oz (82.7 kg)  01/27/17 187 lb (84.8 kg)  01/21/16 175 lb (79.4 kg)      ASSESSMENT AND PLAN:  1. Carotid disease Carotid Duplex from 06/14/2017 Essentially stable 60-79% RICA stenosis. Essentially stable 40-59% LICA stenosis. >50% bilateral ECA stenosis.  Normal subclavian arteries, bilaterally. Patent vertebral arteries with antegrade flow He is asymptomatic, tolerating Lipitor, follow-up scan is scheduled for 06/15/2018.  2. CAD - stable, asymptomatic, normal ECG, no ischemic work up needed  3. HLP - He is on 80 mg of atorvastatin. Lipids at [facility] in 2018, he had repeat labs today we are pending.  Follow-up in one year.  Signed, Tobias Alexander, MD  03/24/2018 10:27 AM    [facility] [address] [place], [place], [place]  [address] Phone: [phone]; Fax: [phone]

## 2018-03-24 NOTE — Patient Instructions (Signed)

## 2018-05-08 ENCOUNTER — Other Ambulatory Visit: Payer: Self-pay | Admitting: Cardiology

## 2018-05-08 DIAGNOSIS — I6523 Occlusion and stenosis of bilateral carotid arteries: Secondary | ICD-10-CM

## 2018-06-15 ENCOUNTER — Ambulatory Visit (HOSPITAL_COMMUNITY)
Admission: RE | Admit: 2018-06-15 | Discharge: 2018-06-15 | Disposition: A | Discharge status: Home / Self Care | Payer: 59 | Source: Ambulatory Visit | Attending: Cardiovascular Disease | Admitting: Cardiovascular Disease

## 2018-06-15 DIAGNOSIS — I2510 Atherosclerotic heart disease of native coronary artery without angina pectoris: Secondary | ICD-10-CM | POA: Diagnosis not present

## 2018-06-15 DIAGNOSIS — E785 Hyperlipidemia, unspecified: Secondary | ICD-10-CM | POA: Insufficient documentation

## 2018-06-15 DIAGNOSIS — Z87891 Personal history of nicotine dependence: Secondary | ICD-10-CM | POA: Insufficient documentation

## 2018-06-15 DIAGNOSIS — I6523 Occlusion and stenosis of bilateral carotid arteries: Secondary | ICD-10-CM | POA: Insufficient documentation

## 2018-06-16 ENCOUNTER — Telehealth: Payer: Self-pay | Admitting: *Deleted

## 2018-06-16 DIAGNOSIS — I739 Peripheral vascular disease, unspecified: Principal | ICD-10-CM

## 2018-06-16 DIAGNOSIS — I779 Disorder of arteries and arterioles, unspecified: Secondary | ICD-10-CM

## 2018-06-16 NOTE — Telephone Encounter (Signed)
Notified the pt of his carotid US results per Dr Delton SeeNelson.  Informed the pt that per Dr Delton SeeNelson, we will do a repeat carotid in one year. Informed the pt that I will place the order in the system and a scheduler will call him back to arrange this for one year out. Pt verbalized understanding and agrees with this plan.

## 2018-06-16 NOTE — Telephone Encounter (Signed)
-----   Message from Lars MassonKatarina H Nelson, MD sent at 06/15/2018  6:58 PM EDT ----- Right Carotid: Velocities in the right ICA are consistent with a 60-79%        stenosis.  Left Carotid: Velocities in the left ICA are consistent with a 1-39% stenosis.        Follow up in 12 months

## 2018-06-16 NOTE — Addendum Note (Signed)
Addended by: Loa SocksMARTIN, IVY M on: 06/16/2018 03:46 PM   Modules accepted: Orders

## 2019-03-29 ENCOUNTER — Other Ambulatory Visit: Payer: Self-pay | Admitting: Cardiology

## 2019-03-29 DIAGNOSIS — I2583 Coronary atherosclerosis due to lipid rich plaque: Principal | ICD-10-CM

## 2019-03-29 DIAGNOSIS — I739 Peripheral vascular disease, unspecified: Secondary | ICD-10-CM

## 2019-03-29 DIAGNOSIS — E7849 Other hyperlipidemia: Secondary | ICD-10-CM

## 2019-03-29 DIAGNOSIS — I2510 Atherosclerotic heart disease of native coronary artery without angina pectoris: Secondary | ICD-10-CM

## 2019-03-29 DIAGNOSIS — I779 Disorder of arteries and arterioles, unspecified: Secondary | ICD-10-CM

## 2019-06-07 ENCOUNTER — Telehealth: Payer: Self-pay | Admitting: *Deleted

## 2019-06-07 ENCOUNTER — Encounter: Payer: Self-pay | Admitting: *Deleted

## 2019-06-07 NOTE — Telephone Encounter (Signed)
Called pt re: in-office appt 06/11/2019 and to ask the covid19 prescreen questions. Left pt a message to call back and have also advised him I would send him a mychart message as well.

## 2019-06-07 NOTE — Progress Notes (Signed)
Cardiology Office Note   Date:  06/11/2019   ID:  Scott Marshall, DOB 01/19/1951, MRN [id number]  PCP:  Wendie Agreste, MD  Cardiologist: Dr. Ena Dawley, MD  Chief Complaint  Patient presents with  . Follow-up  . Coronary Artery Disease   History of Present Illness: Scott Marshall is a 68 y.o. male who presents for follow-up of CAD, seen for Dr. Meda Coffee.  Scott Marshall was last seen by Dr. Meda Coffee on 03/24/2018 in follow-up for CAD s/p CABG and carotid disease.  At that time, he was feeling great.  He continued to exercise at the gym 6 times per week for about an hour in duration without chest pain, shortness of breath, palpitations or dizziness.  He was mildly limited due to left knee arthritis that flares every once in a while however does not significantly stop him from exercising.  Today Scott Marshall is doing great from a cardiac perspective.  Denies chest pain, orthopnea, LE swelling, PND, dizziness or palpitations.  He continues to exercise however is unable to go to the gym currently due to COVID-19.  He walks approximately 90 minutes/day and watches his saturated fat intake.  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  Has scheduled a carotid Doppler study 06/18/2019.  Overall no complaints.  Past Medical History:  Diagnosis Date  . CAD (coronary artery disease)    October, 2012,, catheterization done, CABG  . Carotid artery disease ([facility])    Doppler, October, 2012, 60-79% R. ICA  . Dyslipidemia   . Ejection fraction     EF 65%, catheterization, October, 2012  . Fatigue   . Hx of CABG    October, 2012, Dr. Roxan Hockey.   . Shortness of breath    October, 2012, This was an anginal equivalent    Past Surgical History:  Procedure Laterality Date  . CORONARY ARTERY BYPASS GRAFT  09/2011  . TONSILLECTOMY      Current Outpatient Medications  Medication Sig Dispense Refill  . aspirin 81 MG tablet Take 81 mg by  mouth daily.    Marland Kitchen atorvastatin (LIPITOR) 80 MG tablet TAKE 1 TABLET BY MOUTH EVERY DAY. Please make annual appt with Dr. Meda Coffee for future refills. Thank you 90 tablet 0   No current facility-administered medications for this visit.     Allergies:   Patient has no known allergies.   Social History:  The patient  reports that he quit smoking about 34 years ago. He has never used smokeless tobacco. He reports current alcohol use. He reports that he does not use drugs.   Family History:  The patient's family history includes Coronary artery disease in his unknown relative; Dementia in his mother; Heart attack (age of onset: 76) in his father; Hyperlipidemia in his brother; Hypertension in his unknown relative; Stroke in his mother; Thyroid disease in his mother.    ROS:  Please see the history of present illness.   Otherwise, review of systems are positive for none.   All other systems are reviewed and negative.   PHYSICAL EXAM: VS:  BP 122/78   Pulse 60   Ht 5\' 7"  (1.702 m)   Wt 186 lb 1.9 oz (84.4 kg)   SpO2 98%   BMI 29.15 kg/m  , BMI Body mass index is 29.15 kg/m.   General: Well developed, well nourished, NAD Skin: Warm, dry, intact  Neck: Negative  for carotid bruits. No JVD Lungs:Clear to ausculation bilaterally. No wheezes, rales, or rhonchi. Breathing is unlabored. Cardiovascular: RRR with S1 S2. No murmurs, rubs, gallops, or LV heave appreciated. Extremities: No edema. No clubbing or cyanosis. DP/PT pulses 2+ bilaterally Neuro: Alert and oriented. No focal deficits. No facial asymmetry. MAE spontaneously. Psych: Responds to questions appropriately with normal affect.     EKG:  EKG is ordered today. The ekg ordered today demonstrates NSR with no acute changes    Recent Labs: No results found for requested labs within last 8760 hours.   Lipid Panel    Component Value Date/Time   CHOL 158 03/24/2018 0946   TRIG 87 03/24/2018 0946   HDL 37 (L) 03/24/2018 0946    CHOLHDL 4.3 03/24/2018 0946   CHOLHDL 4.9 12/10/2015 0929   VLDL 11 12/10/2015 0929   LDLCALC 104 (H) 03/24/2018 0946    Wt Readings from Last 3 Encounters:  06/11/19 186 lb 1.9 oz (84.4 kg)  03/24/18 182 lb 6.4 oz (82.7 kg)  01/27/17 187 lb (84.8 kg)    Other studies Reviewed: Additional studies/ records that were reviewed today include:   Bilateral carotid duplex study 06/14/2017: Stable 60-79% RICA stenosis.  Stable 40-59% LICA stenosis, based on peak systolic ICA velocities.  Follow up in 1 year  Bilateral carotid duplex study scheduled for 06/18/2019: Pending  ASSESSMENT AND PLAN:  1.  CAD s/p CABG: -Stable, denies anginal symptoms -EKG shows NSR with no acute changes  -No further ischemic work-up necessary at this time -Continue ASA, atorvastatin  2.  HLD: -Last LDL, 104 on 03/24/2018 -LFTs stable, AST/ALT, 30/32 -Continue high intensity atorvastatin 80 mg daily -Repeat lab work today>>long discussion regarding possible addition of Daryl EasternXetia>>repatha is no improvement with LDL however patient reluctant to add more medications at this time  3.  Carotid disease: -Last carotid duplex from 06/14/2017 with RICA stenosis at 60 to 79%, LICA stenosis at 40 to 59% -Asymptomatic -Follow-up scan from 06/15/2018 not yet complete with reschedule for 06/18/2019>>pending  -Continue ASA, atorvastatin   Current medicines are reviewed at length with the patient today.  The patient does not have concerns regarding medicines.  The following changes have been made:  no change  Labs/ tests ordered today include:  Orders Placed This Encounter  Procedures  . Basic metabolic panel  . Lipid panel  . Hepatic function panel  . EKG 12-Lead    Disposition:   FU with Dr. Delton SeeNelson in 1 years  Signed, Georgie ChardJill Arlinda Barcelona, NP  06/11/2019 10:37 AM    [facility] [address], [place], [place]  [address] Phone: [phone]) [phone]; Fax: [phone]) [phone]

## 2019-06-07 NOTE — Telephone Encounter (Signed)
Pt returned my call and he has answered no to all the covid19 prescreen questions.

## 2019-06-11 ENCOUNTER — Ambulatory Visit: Payer: Medicare Other | Admitting: Cardiology

## 2019-06-11 ENCOUNTER — Other Ambulatory Visit: Payer: Self-pay

## 2019-06-11 ENCOUNTER — Encounter: Payer: Self-pay | Admitting: Cardiology

## 2019-06-11 VITALS — BP 122/78 | HR 60 | Ht 67.0 in | Wt 186.1 lb

## 2019-06-11 DIAGNOSIS — I2583 Coronary atherosclerosis due to lipid rich plaque: Secondary | ICD-10-CM

## 2019-06-11 DIAGNOSIS — I779 Disorder of arteries and arterioles, unspecified: Secondary | ICD-10-CM

## 2019-06-11 DIAGNOSIS — I739 Peripheral vascular disease, unspecified: Secondary | ICD-10-CM | POA: Diagnosis not present

## 2019-06-11 DIAGNOSIS — E7849 Other hyperlipidemia: Secondary | ICD-10-CM | POA: Diagnosis not present

## 2019-06-11 DIAGNOSIS — I2510 Atherosclerotic heart disease of native coronary artery without angina pectoris: Secondary | ICD-10-CM | POA: Diagnosis not present

## 2019-06-11 DIAGNOSIS — Z79899 Other long term (current) drug therapy: Secondary | ICD-10-CM

## 2019-06-11 LAB — LIPID PANEL
Chol/HDL Ratio: 4.3 ratio (ref 0.0–5.0)
Cholesterol, Total: 155 mg/dL (ref 100–199)
HDL: 36 mg/dL — ABNORMAL LOW (ref >39)
LDL Calculated: 101 mg/dL — ABNORMAL HIGH (ref 0–99)
Triglycerides: 89 mg/dL (ref 0–149)
VLDL Cholesterol Cal: 18 mg/dL (ref 5–40)

## 2019-06-11 LAB — BASIC METABOLIC PANEL
BUN/Creatinine Ratio: 20 (ref 10–24)
BUN: 25 mg/dL (ref 8–27)
CO2: 22 mmol/L (ref 20–29)
Calcium: 9.2 mg/dL (ref 8.6–10.2)
Chloride: 103 mmol/L (ref 96–106)
Creatinine, Ser: 1.23 mg/dL (ref 0.76–1.27)
GFR calc Af Amer: 70 mL/min/{1.73_m2} (ref >59)
GFR calc non Af Amer: 61 mL/min/{1.73_m2} (ref >59)
Glucose: 113 mg/dL — ABNORMAL HIGH (ref 65–99)
Potassium: 4.4 mmol/L (ref 3.5–5.2)
Sodium: 140 mmol/L (ref 134–144)

## 2019-06-11 LAB — HEPATIC FUNCTION PANEL
ALT: 29 IU/L (ref 0–44)
AST: 25 IU/L (ref 0–40)
Albumin: 4.2 g/dL (ref 3.8–4.8)
Alkaline Phosphatase: 93 IU/L (ref 39–117)
Bilirubin Total: 1.2 mg/dL (ref 0.0–1.2)
Bilirubin, Direct: 0.27 mg/dL (ref 0.00–0.40)
Total Protein: 6.5 g/dL (ref 6.0–8.5)

## 2019-06-11 MED ORDER — ATORVASTATIN CALCIUM 80 MG PO TABS: ORAL_TABLET | ORAL | 3 refills | Status: DC

## 2019-06-11 NOTE — Patient Instructions (Signed)
Medication Instructions:  Your physician recommends that you continue on your current medications as directed. Please refer to the Current Medication list given to you today.  If you need a refill on your cardiac medications before your next appointment, please call your pharmacy.   Lab work: TODAY: LIPIDS, LFT'S & BMET   If you have labs (blood work) drawn today and your tests are completely normal, you will receive your results only by: Marland Kitchen MyChart Message (if you have MyChart) OR . A paper copy in the mail If you have any lab test that is abnormal or we need to change your treatment, we will call you to review the results.  Testing/Procedures: None   Follow-Up: At [facility], you and your health needs are our priority.  As part of our continuing mission to provide you with exceptional heart care, we have created designated Provider Care Teams.  These Care Teams include your primary Cardiologist (physician) and Advanced Practice Providers (APPs -  Physician Assistants and Nurse Practitioners) who all work together to provide you with the care you need, when you need it. You will need a follow up appointment in 12 months.  Please call our office 2 months in advance to schedule this appointment.  You may see Dr. Meda Coffee  or one of the following Advanced Practice Providers on your designated Care Team:   [place], PA-C Melina Copa, PA-C . Ermalinda Barrios, PA-C  Any Other Special Instructions Will Be Listed Below (If Applicable).

## 2019-06-18 ENCOUNTER — Other Ambulatory Visit (HOSPITAL_COMMUNITY): Payer: Self-pay | Admitting: Cardiology

## 2019-06-18 ENCOUNTER — Other Ambulatory Visit: Payer: Self-pay

## 2019-06-18 ENCOUNTER — Telehealth: Payer: Self-pay | Admitting: Cardiology

## 2019-06-18 ENCOUNTER — Ambulatory Visit (HOSPITAL_COMMUNITY)
Admission: RE | Admit: 2019-06-18 | Discharge: 2019-06-18 | Disposition: A | Discharge status: Home / Self Care | Payer: Medicare Other | Source: Ambulatory Visit | Attending: Internal Medicine | Admitting: Internal Medicine

## 2019-06-18 DIAGNOSIS — I6523 Occlusion and stenosis of bilateral carotid arteries: Secondary | ICD-10-CM

## 2019-06-18 DIAGNOSIS — I779 Disorder of arteries and arterioles, unspecified: Secondary | ICD-10-CM

## 2019-06-18 DIAGNOSIS — I739 Peripheral vascular disease, unspecified: Secondary | ICD-10-CM

## 2019-06-18 NOTE — Telephone Encounter (Signed)
Spoke with the pt and informed him of his carotid US results and recommendations per Dr Meda Coffee.  Informed the pt that per Dr Meda Coffee, there is no intervention needed at this time, unless he is symptomatic.  Pt replied he has no symptoms at all, and is aware to call if he does.  Informed the pt that given he is asymptomatic, we will just do a repeat carotid US in one year.  Informed the pt that I will place the order in the system and send a message to our [facility] scheduler, to arrange this appt for one year out.  Pt verbalized understanding and agrees with this plan.

## 2019-06-18 NOTE — Telephone Encounter (Signed)
-----   Message from Dorothy Spark, MD sent at 06/18/2019  3:03 PM EDT ----- Right Carotid: 60-79% stenosis.  Left Carotid: 40-59% stenosis.               No intervention needed right now, call if any symptoms, otherwise repeat test in 12 months

## 2020-04-16 ENCOUNTER — Telehealth: Payer: Self-pay | Admitting: Cardiology

## 2020-04-16 DIAGNOSIS — E7849 Other hyperlipidemia: Secondary | ICD-10-CM

## 2020-04-16 DIAGNOSIS — I779 Disorder of arteries and arterioles, unspecified: Secondary | ICD-10-CM

## 2020-04-16 DIAGNOSIS — I2583 Coronary atherosclerosis due to lipid rich plaque: Secondary | ICD-10-CM

## 2020-04-16 DIAGNOSIS — I2510 Atherosclerotic heart disease of native coronary artery without angina pectoris: Secondary | ICD-10-CM

## 2020-04-16 DIAGNOSIS — Z951 Presence of aortocoronary bypass graft: Secondary | ICD-10-CM

## 2020-04-16 DIAGNOSIS — R7989 Other specified abnormal findings of blood chemistry: Secondary | ICD-10-CM

## 2020-04-16 NOTE — Telephone Encounter (Signed)
Lab orders for CMET, CBC, TSH and LIPIDS placed for this pt to have done same day as he see's Dr. Delton See on 06/20/20.  Pt is aware to come earlier prior to his appt with Dr. Delton See, to have these drawn.  Lab appt made same day for 06/20/20.  Pt is also aware to come fasting to this lab appt.  Pt verbalized understanding and agrees with this plan.

## 2020-04-16 NOTE — Telephone Encounter (Signed)
New message  Patient is wanting to have blood work done to the same day of his appointment with Dr. Delton See on 06/26/20. Please assist with orders and scheduling lab work on 06/20/20.

## 2020-06-16 ENCOUNTER — Other Ambulatory Visit: Payer: Self-pay

## 2020-06-16 ENCOUNTER — Other Ambulatory Visit (HOSPITAL_COMMUNITY): Payer: Self-pay | Admitting: Cardiology

## 2020-06-16 ENCOUNTER — Ambulatory Visit (HOSPITAL_COMMUNITY)
Admission: RE | Admit: 2020-06-16 | Discharge: 2020-06-16 | Disposition: A | Discharge status: Home / Self Care | Payer: Medicare Other | Source: Ambulatory Visit | Attending: Cardiology | Admitting: Cardiology

## 2020-06-16 DIAGNOSIS — I6523 Occlusion and stenosis of bilateral carotid arteries: Secondary | ICD-10-CM | POA: Diagnosis not present

## 2020-06-16 DIAGNOSIS — I779 Disorder of arteries and arterioles, unspecified: Secondary | ICD-10-CM

## 2020-06-17 ENCOUNTER — Telehealth: Payer: Self-pay | Admitting: *Deleted

## 2020-06-17 DIAGNOSIS — I779 Disorder of arteries and arterioles, unspecified: Secondary | ICD-10-CM

## 2020-06-17 NOTE — Telephone Encounter (Signed)
-----   Message from Loa Socks, LPN sent at 6/50/3546  8:33 AM EDT -----  ----- Message ----- From: Lars Masson, MD Sent: 06/16/2020   9:30 PM EDT To: Loa Socks, LPN  Stable 56-81% stenosis on the right and 40-59% stenosis on the left carotid artery, repeat in 12 months unless any symptoms (weakness, dizziness) occur.

## 2020-06-20 ENCOUNTER — Ambulatory Visit: Payer: Medicare Other | Admitting: Cardiology

## 2020-06-20 ENCOUNTER — Other Ambulatory Visit: Payer: Self-pay

## 2020-06-20 ENCOUNTER — Other Ambulatory Visit: Payer: Medicare Other | Admitting: *Deleted

## 2020-06-20 VITALS — BP 146/82 | HR 58 | Ht 67.0 in | Wt 176.0 lb

## 2020-06-20 DIAGNOSIS — I2510 Atherosclerotic heart disease of native coronary artery without angina pectoris: Secondary | ICD-10-CM

## 2020-06-20 DIAGNOSIS — I779 Disorder of arteries and arterioles, unspecified: Secondary | ICD-10-CM | POA: Diagnosis not present

## 2020-06-20 DIAGNOSIS — R7989 Other specified abnormal findings of blood chemistry: Secondary | ICD-10-CM

## 2020-06-20 DIAGNOSIS — Z951 Presence of aortocoronary bypass graft: Secondary | ICD-10-CM

## 2020-06-20 DIAGNOSIS — I2583 Coronary atherosclerosis due to lipid rich plaque: Secondary | ICD-10-CM | POA: Diagnosis not present

## 2020-06-20 DIAGNOSIS — E7849 Other hyperlipidemia: Secondary | ICD-10-CM

## 2020-06-20 LAB — COMPREHENSIVE METABOLIC PANEL
ALT: 19 IU/L (ref 0–44)
AST: 23 IU/L (ref 0–40)
Albumin/Globulin Ratio: 2.2 (ref 1.2–2.2)
Albumin: 4.6 g/dL (ref 3.8–4.8)
Alkaline Phosphatase: 89 IU/L (ref 48–121)
BUN/Creatinine Ratio: 22 (ref 10–24)
BUN: 28 mg/dL — ABNORMAL HIGH (ref 8–27)
Bilirubin Total: 0.7 mg/dL (ref 0.0–1.2)
CO2: 23 mmol/L (ref 20–29)
Calcium: 9.1 mg/dL (ref 8.6–10.2)
Chloride: 103 mmol/L (ref 96–106)
Creatinine, Ser: 1.25 mg/dL (ref 0.76–1.27)
GFR calc Af Amer: 68 mL/min/{1.73_m2} (ref >59)
GFR calc non Af Amer: 59 mL/min/{1.73_m2} — ABNORMAL LOW (ref >59)
Globulin, Total: 2.1 g/dL (ref 1.5–4.5)
Glucose: 110 mg/dL — ABNORMAL HIGH (ref 65–99)
Potassium: 4.2 mmol/L (ref 3.5–5.2)
Sodium: 139 mmol/L (ref 134–144)
Total Protein: 6.7 g/dL (ref 6.0–8.5)

## 2020-06-20 LAB — CBC
Hematocrit: 46.6 % (ref 37.5–51.0)
Hemoglobin: 15.1 g/dL (ref 13.0–17.7)
MCH: 30.8 pg (ref 26.6–33.0)
MCHC: 32.4 g/dL (ref 31.5–35.7)
MCV: 95 fL (ref 79–97)
Platelets: 146 10*3/uL — ABNORMAL LOW (ref 150–450)
RBC: 4.9 x10E6/uL (ref 4.14–5.80)
RDW: 13 % (ref 11.6–15.4)
WBC: 9.4 10*3/uL (ref 3.4–10.8)

## 2020-06-20 LAB — LIPID PANEL
Chol/HDL Ratio: 4 ratio (ref 0.0–5.0)
Cholesterol, Total: 164 mg/dL (ref 100–199)
HDL: 41 mg/dL (ref >39)
LDL Chol Calc (NIH): 107 mg/dL — ABNORMAL HIGH (ref 0–99)
Triglycerides: 86 mg/dL (ref 0–149)
VLDL Cholesterol Cal: 16 mg/dL (ref 5–40)

## 2020-06-20 LAB — TSH: TSH: 10.6 u[IU]/mL — ABNORMAL HIGH (ref 0.450–4.500)

## 2020-06-20 MED ORDER — ATORVASTATIN CALCIUM 80 MG PO TABS: ORAL_TABLET | ORAL | 3 refills | Status: DC

## 2020-06-20 NOTE — Progress Notes (Signed)
Cardiology Office Note:    Date:  06/20/2020   ID:  [name], DOB 13-Jul-1951, MRN [id number]  PCP:  Shade Flood, MD  [facility] Cardiologist:  Tobias Alexander, MD  [facility] Electrophysiologist:  None   Referring MD: Shade Flood, MD   Chief Complaint  Patient presents with  . NO COMPLAINTS   History of Present Illness:    Scott Marshall is a 69 y.o. male with a hx of CAD s/p CABG and carotid disease.  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  He denies any chest pain or shortness of breath, he has no palpitation dizziness or syncope no lower extremity edema.  He is compliant with aspirin and atorvastatin and has no side effects.  He had his blood work done prior to this appointment but we do not have the results yet. He recently underwent repeat carotid ultrasound that shows stable 60 to 79% stenosis on the right carotid artery and 40 to 59% stenosis on the left carotid artery.  He denies any symptoms of unilateral weakness, dizziness or problems with walking.  Past Medical History:  Diagnosis Date  . CAD (coronary artery disease)    October, 2012,, catheterization done, CABG  . Carotid artery disease (HCC)    Doppler, October, 2012, 60-79% R. ICA  . Dyslipidemia   . Ejection fraction     EF 65%, catheterization, October, 2012  . Fatigue   . Hx of CABG    October, 2012, Dr. Dorris Fetch.   . Shortness of breath    October, 2012, This was an anginal equivalent    Past Surgical History:  Procedure Laterality Date  . CORONARY ARTERY BYPASS GRAFT  09/2011  . TONSILLECTOMY      Current Medications: Current Meds  Medication Sig  . aspirin 81 MG tablet Take 81 mg by mouth daily.  Marland Kitchen atorvastatin (LIPITOR) 80 MG tablet TAKE 1 TABLET BY MOUTH EVERY DAY.  . [DISCONTINUED] atorvastatin (LIPITOR) 80 MG tablet TAKE 1 TABLET BY MOUTH EVERY DAY.      Allergies:   Patient has no known allergies.   Social History   Socioeconomic History  . Marital status: Married    Spouse name: Not on file  . Number of children: 0  . Years of education: Not on file  . Highest education level: Not on file  Occupational History  . Occupation: management @ [employer]  Tobacco Use  . Smoking status: Former Smoker    Quit date: 12/20/1984    Years since quitting: 35.5  . Smokeless tobacco: Never Used  Substance and Sexual Activity  . Alcohol use: Yes    Comment: 2-3 a year maybe  . Drug use: No  . Sexual activity: Not on file  Other Topics Concern  . Not on file  Social History Narrative  . Not on file   Social Determinants of Health   Financial Resource Strain:   . Difficulty of Paying Living Expenses:   Food Insecurity:   . Worried About Programme researcher, broadcasting/film/video in the Last Year:   . Barista in the Last Year:   Transportation Needs:   . Freight forwarder (Medical):   Marland Kitchen Lack of Transportation (Non-Medical):   Physical Activity:   . Days of Exercise per Week:   . Minutes of Exercise per Session:  Stress:   . Feeling of Stress :   Social Connections:   . Frequency of Communication with Friends and Family:   . Frequency of Social Gatherings with Friends and Family:   . Attends Religious Services:   . Active Member of Clubs or Organizations:   . Attends Banker Meetings:   Marland Kitchen Marital Status:      Family History: The patient's family history includes Coronary artery disease in his unknown relative; Dementia in his mother; Heart attack (age of onset: 81) in his father; Hyperlipidemia in his brother; Hypertension in his unknown relative; Stroke in his mother; Thyroid disease in his mother.  ROS:   Please see the history of present illness.    All other systems reviewed and are negative.  EKGs/Labs/Other Studies Reviewed:    The following studies were reviewed today:  EKG:  EKG is ordered today.  The ekg  ordered today demonstrates sinus bradycardia, 58 bpm, normal EKG, this is unchanged from prior and personally reviewed.  Recent Labs: No results found for requested labs within last 8760 hours.  Recent Lipid Panel    Component Value Date/Time   CHOL 155 06/11/2019 1038   TRIG 89 06/11/2019 1038   HDL 36 (L) 06/11/2019 1038   CHOLHDL 4.3 06/11/2019 1038   CHOLHDL 4.9 12/10/2015 0929   VLDL 11 12/10/2015 0929   LDLCALC 101 (H) 06/11/2019 1038    Physical Exam:    VS:  BP (!) 146/82   Pulse (!) 58   Ht 5\' 7"  (1.702 m)   Wt 176 lb (79.8 kg)   BMI 27.57 kg/m     Wt Readings from Last 3 Encounters:  06/20/20 176 lb (79.8 kg)  06/11/19 186 lb 1.9 oz (84.4 kg)  03/24/18 182 lb 6.4 oz (82.7 kg)     GEN: Well nourished, well developed in no acute distress HEENT: Normal NECK: No JVD; bruit in the right carotid artery LYMPHATICS: No lymphadenopathy CARDIAC: RRR, no murmurs, rubs, gallops RESPIRATORY:  Clear to auscultation without rales, wheezing or rhonchi  ABDOMEN: Soft, non-tender, non-distended MUSCULOSKELETAL:  No edema; No deformity  SKIN: Warm and dry NEUROLOGIC:  Alert and oriented x 3 PSYCHIATRIC:  Normal affect   ASSESSMENT:    1. Hx of CABG   2. Coronary artery disease due to lipid rich plaque   3. Other hyperlipidemia   4. Carotid artery disease, unspecified laterality (HCC)    PLAN:    In order of problems listed above:  1. CAD status post CABG -he is completely asymptomatic, EKG today is normal, he is exercising vigorously and is congratulated on that.  He continues to take aspirin and atorvastatin only. 2. Blood pressure -always elevated secondary to whitecoat syndrome, he brings a diary from home and his blood pressure runs in 110s over 50s. 3. Hyperlipidemia -he is tolerating atorvastatin well and his lipids are at goal.  We will continue  His blood work was obtained today the results are not ready yet.   Medication Adjustments/Labs and Tests Ordered:  Current medicines are reviewed at length with the patient today.  Concerns regarding medicines are outlined above.  Orders Placed This Encounter  Procedures  . EKG 12-Lead   Meds ordered this encounter  Medications  . atorvastatin (LIPITOR) 80 MG tablet    Sig: TAKE 1 TABLET BY MOUTH EVERY DAY.    Dispense:  90 tablet    Refill:  3    Patient Instructions  Medication Instructions:  Your physician recommends  that you continue on your current medications as directed. Please refer to the Current Medication list given to you today.  *If you need a refill on your cardiac medications before your next appointment, please call your pharmacy*  Follow-Up: At [facility], you and your health needs are our priority.  As part of our continuing mission to provide you with exceptional heart care, we have created designated Provider Care Teams.  These Care Teams include your primary Cardiologist (physician) and Advanced Practice Providers (APPs -  Physician Assistants and Nurse Practitioners) who all work together to provide you with the care you need, when you need it.   Your next appointment:   1 year(s)  The format for your next appointment:   In Person  Provider:   You may see Tobias Alexander, MD or one of the following Advanced Practice Providers on your designated Care Team:    Ronie Spies, PA-C  Jacolyn Reedy, PA-C         Signed, Tobias Alexander, MD  06/20/2020 10:36 AM    [facility]

## 2020-06-20 NOTE — Patient Instructions (Signed)
Medication Instructions:  Your physician recommends that you continue on your current medications as directed. Please refer to the Current Medication list given to you today.  *If you need a refill on your cardiac medications before your next appointment, please call your pharmacy*   Follow-Up: At [facility], you and your health needs are our priority.  As part of our continuing mission to provide you with exceptional heart care, we have created designated Provider Care Teams.  These Care Teams include your primary Cardiologist (physician) and Advanced Practice Providers (APPs -  Physician Assistants and Nurse Practitioners) who all work together to provide you with the care you need, when you need it.  Your next appointment:   1 year(s)  The format for your next appointment:   In Person  Provider:   You may see Katarina Nelson, MD or one of the following Advanced Practice Providers on your designated Care Team:    Dayna Dunn, PA-C  Michele Lenze, PA-C    

## 2020-07-01 LAB — SPECIMEN STATUS REPORT

## 2020-07-01 LAB — T3, FREE: T3, Free: 3.3 pg/mL (ref 2.0–4.4)

## 2020-07-01 LAB — T4, FREE: Free T4: 1.09 ng/dL (ref 0.82–1.77)

## 2021-03-17 ENCOUNTER — Telehealth: Payer: Self-pay | Admitting: Cardiology

## 2021-03-17 DIAGNOSIS — E785 Hyperlipidemia, unspecified: Secondary | ICD-10-CM

## 2021-03-17 DIAGNOSIS — E7849 Other hyperlipidemia: Secondary | ICD-10-CM

## 2021-03-17 DIAGNOSIS — I2583 Coronary atherosclerosis due to lipid rich plaque: Secondary | ICD-10-CM

## 2021-03-17 DIAGNOSIS — Z951 Presence of aortocoronary bypass graft: Secondary | ICD-10-CM

## 2021-03-17 DIAGNOSIS — I2510 Atherosclerotic heart disease of native coronary artery without angina pectoris: Secondary | ICD-10-CM

## 2021-03-17 DIAGNOSIS — Z79899 Other long term (current) drug therapy: Secondary | ICD-10-CM

## 2021-03-17 NOTE — Telephone Encounter (Signed)
    Pt made his yearly f/u appt, he said he normally get his fasting lab work the same day of his appt so it will be all in one day, but there's not order on file

## 2021-03-17 NOTE — Telephone Encounter (Signed)
Pt called in to make his yearly follow-up appt with Dr. Shari Prows, who will be taking on his care due to Dr. Delton See leaving.  Pt is scheduled to see Dr. Shari Prows for yearly follow-up on 06/30/21. Pt states he always has his yearly labs done same day as his yearly follow-up appt with Dr. Delton See, and was wondering if he could also do that with Dr. Shari Prows.  Pt gets his yearly labs of CMET, TSH, CBC and Lipids done.  Orders placed for him to get his labs done same day as he see's Dr Shari Prows on 06/30/21.  We will draw CMET, CBC, TSH, and lipids on him at that time.  He is aware to come fasting.  Pt verbalized understanding and agrees with this plan. Pt was more than gracious for all the assistance provided.

## 2021-05-27 ENCOUNTER — Telehealth: Payer: Medicare Other | Admitting: Physician Assistant

## 2021-05-27 ENCOUNTER — Encounter: Payer: Self-pay | Admitting: Physician Assistant

## 2021-05-27 DIAGNOSIS — U071 COVID-19: Secondary | ICD-10-CM | POA: Diagnosis not present

## 2021-05-27 MED ORDER — MOLNUPIRAVIR EUA 200MG CAPSULE: 4.0000 | ORAL_CAPSULE | Freq: Two times a day (BID) | ORAL | 0 refills | Status: AC

## 2021-05-27 NOTE — Progress Notes (Signed)
Scott Marshall, boldman are scheduled for a virtual visit with your provider today.    Just as we do with appointments in the office, we must obtain your consent to participate.  Your consent will be active for this visit and any virtual visit you may have with one of our providers in the next 365 days.    If you have a MyChart account, I can also send a copy of this consent to you electronically.  All virtual visits are billed to your insurance company just like a traditional visit in the office.  As this is a virtual visit, video technology does not allow for your provider to perform a traditional examination.  This may limit your provider's ability to fully assess your condition.  If your provider identifies any concerns that need to be evaluated in person or the need to arrange testing such as labs, EKG, etc, we will make arrangements to do so.    Although advances in technology are sophisticated, we cannot ensure that it will always work on either your end or our end.  If the connection with a video visit is poor, we may have to switch to a telephone visit.  With either a video or telephone visit, we are not always able to ensure that we have a secure connection.   I need to obtain your verbal consent now.   Are you willing to proceed with your visit today?   [name] has provided verbal consent on 05/27/2021 for a virtual visit (video or telephone).   Margaretann Loveless, PA-C 05/27/2021  12:19 PM    MyChart Video Visit    Virtual Visit via Video Note   This visit type was conducted due to national recommendations for restrictions regarding the COVID-19 Pandemic (e.g. social distancing) in an effort to limit this patient's exposure and mitigate transmission in our community. This patient is at least at moderate risk for complications without adequate follow up. This format is felt to be most appropriate for this patient at this time. Physical exam was limited by quality of the video and audio  technology used for the visit.   Patient location: Home Provider location: Home office in [place]  I discussed the limitations of evaluation and management by telemedicine and the availability of in person appointments. The patient expressed understanding and agreed to proceed.  Patient: Scott Marshall   DOB: 1951-04-14   70 y.o. Male  MRN: [id number] Visit Date: 05/27/2021  Today's healthcare provider: Margaretann Loveless, PA-C   No chief complaint on file.  Subjective    URI  This is a new problem. The current episode started yesterday. The problem has been unchanged. There has been no fever. Associated symptoms include congestion, coughing (very mild) and rhinorrhea. Pertinent negatives include no headaches, sinus pain, sneezing, sore throat, swollen glands or wheezing. He has tried nothing for the symptoms.    Tested positive this morning for Covid 19. Wife tested positive for Covid on Sunday, 05/24/21. He is fully vaccinated and boosted x 2. (Second booster was received on 05/25/21).  Patient Active Problem List   Diagnosis Date Noted  . Elevated TSH 02/02/2017  . Hyperlipidemia 01/27/2017  . Dyslipidemia   . Ejection fraction   . Carotid artery disease (HCC)   . CAD (coronary artery disease)   . Hx of CABG   . Shortness of breath   . Fatigue    Past Medical History:  Diagnosis Date  . CAD (coronary artery disease)  October, 2012,, catheterization done, CABG  . Carotid artery disease (HCC)    Doppler, October, 2012, 60-79% R. ICA  . Dyslipidemia   . Ejection fraction     EF 65%, catheterization, October, 2012  . Fatigue   . Hx of CABG    October, 2012, Dr. Dorris Fetch.   . Shortness of breath    October, 2012, This was an anginal equivalent      Medications: Outpatient Medications Prior to Visit  Medication Sig  . aspirin 81 MG tablet Take 81 mg by mouth daily.  Marland Kitchen atorvastatin (LIPITOR) 80 MG tablet TAKE 1 TABLET BY MOUTH EVERY DAY.   No  facility-administered medications prior to visit.    Review of Systems  Constitutional: Positive for fatigue (had to cut his 90 minute walk down to 45 minutes yesterday). Negative for appetite change, chills and fever.  HENT: Positive for congestion and rhinorrhea. Negative for sinus pain, sneezing and sore throat.   Respiratory: Positive for cough (very mild). Negative for chest tightness, shortness of breath and wheezing.   Cardiovascular: Negative.   Neurological: Negative for dizziness and headaches.    Last CBC Lab Results  Component Value Date   WBC 9.4 06/20/2020   HGB 15.1 06/20/2020   HCT 46.6 06/20/2020   MCV 95 06/20/2020   MCH 30.8 06/20/2020   RDW 13.0 06/20/2020   PLT 146 (L) 06/20/2020   Last metabolic panel Lab Results  Component Value Date   GLUCOSE 110 (H) 06/20/2020   NA 139 06/20/2020   K 4.2 06/20/2020   CL 103 06/20/2020   CO2 23 06/20/2020   BUN 28 (H) 06/20/2020   CREATININE 1.25 06/20/2020   GFRNONAA 59 (L) 06/20/2020   GFRAA 68 06/20/2020   CALCIUM 9.1 06/20/2020   PROT 6.7 06/20/2020   ALBUMIN 4.6 06/20/2020   LABGLOB 2.1 06/20/2020   AGRATIO 2.2 06/20/2020   BILITOT 0.7 06/20/2020   ALKPHOS 89 06/20/2020   AST 23 06/20/2020   ALT 19 06/20/2020      Objective    There were no vitals taken for this visit. BP Readings from Last 3 Encounters:  06/20/20 (!) 146/82  06/11/19 122/78  03/24/18 (!) 154/90   Wt Readings from Last 3 Encounters:  06/20/20 176 lb (79.8 kg)  06/11/19 186 lb 1.9 oz (84.4 kg)  03/24/18 182 lb 6.4 oz (82.7 kg)      Physical Exam Vitals reviewed.  Constitutional:      General: He is not in acute distress.    Appearance: Normal appearance. He is well-developed. He is not ill-appearing.  HENT:     Head: Normocephalic and atraumatic.  Eyes:     Conjunctiva/sclera: Conjunctivae normal.  Pulmonary:     Effort: Pulmonary effort is normal. No respiratory distress (able to speak in full, coherent sentences  without issue).  Musculoskeletal:     Cervical back: Normal range of motion and neck supple.  Neurological:     Mental Status: He is alert.  Psychiatric:        Mood and Affect: Mood normal.        Behavior: Behavior normal.        Thought Content: Thought content normal.        Judgment: Judgment normal.        Assessment & Plan     1. COVID-19 - Continue OTC symptomatic management of choice - Will send OTC vitamins and supplement information through [facility] - Molnupiravir provided to patient (older male with CAD  increase risk factors for severe progression of Covid-19) - Patient enrolled in MyChart symptom monitoring - Push fluids - Rest as needed - Discussed return precautions and when to seek in-person evaluation, sent via [facility] as well - molnupiravir EUA 200 mg CAPS; Take 4 capsules (800 mg total) by mouth 2 (two) times daily for 5 days.  Dispense: 40 capsule; Refill: 0 - MyChart COVID-19 home monitoring program; Future - Temperature monitoring; Future   No follow-ups on file.     I discussed the assessment and treatment plan with the patient. The patient was provided an opportunity to ask questions and all were answered. The patient agreed with the plan and demonstrated an understanding of the instructions.   The patient was advised to call back or seek an in-person evaluation if the symptoms worsen or if the condition fails to improve as anticipated.  I provided 16 minutes of face-to-face time during this encounter via MyChart Video enabled encounter.   Reine Just [facility] [phone] (phone) [phone] (fax)  [facility]

## 2021-05-27 NOTE — Patient Instructions (Signed)
Hello Scott Marshall,  You are being placed in the home monitoring program for COVID-19 (commonly known as Coronavirus).  This is because you are suspected to have the virus or are known to have the virus.  If you are unsure which group you fall into call your clinic.    As part of this program, you'll answer a daily questionnaire in the MyChart mobile app. You'll receive a notification through the MyChart app when the questionnaire is available. When you log in to MyChart, you'll see the tasks in your To Do activity.       Clinicians will see any answers that are concerning and take appropriate steps.  If at any point you are having a medical emergency, call 911.  If otherwise concerned call your clinic instead of coming into the clinic or hospital.  To keep from spreading the disease you should: Stay home and limit contact with other people as much as possible.  Wash your hands frequently. Cover your coughs and sneezes with a tissue, and throw used tissues in the trash.   Clean and disinfect frequently touched surfaces and objects.    Take care of yourself by: Staying home Resting Drinking fluids Take fever-reducing medications (Tylenol/Acetaminophen and Ibuprofen)  For more information on the disease go to the Centers for Disease Control and Prevention website     You are being prescribed MOLNUPIRAVIR for COVID-19 infection.   Please pick up your prescription at: [facility] [address]    Please call the pharmacy or go through the drive through vs going inside if you are picking up the mediation yourself to prevent further spread. If prescribed to a [facility] affiliated pharmacy, a pharmacist will bring the medication out to your car.   ADMINISTRATION INSTRUCTIONS: 1. Take with or without food. Swallow the tablets whole. Don't chew, crush, or break the medications because it might not work as well  2. For each dose of the medication, you should be taking FOUR tablets at one time, TWICE a day    3. Finish your full five-day course of Molnupiravir even if you feel better before you're done. Stopping this medication too early can make it less effective to prevent severe illness related to COVID19.    4. Molnupiravir is prescribed for YOU ONLY. Don't share it with others, even if they have similar symptoms as you. This medication might not be right for everyone.   5. Make sure to take steps to protect yourself and others while you're taking this medication in order to get well soon and to prevent others from getting sick with COVID-19.   **If you are of childbearing potential (any gender) - it is advised to not get pregnant while taking this medication and recommended that condoms are used for male partners the next 3 months after taking the medication out of extreme caution    COMMON SIDE EFFECTS: 1. Diarrhea 2. Nausea  3. Dizziness    If your COVID-19 symptoms get worse, get medical help right away. Call 911 if you experience symptoms such as worsening cough, trouble breathing, chest pain that doesn't go away, confusion, a hard time staying awake, and pale or blue-colored skin. This medication won't prevent all COVID-19 cases from getting worse.    Can take to lessen severity: Vit C 500mg  twice daily Quercertin 250-500mg  twice daily Zinc 75-100mg  daily Melatonin 3-6 mg at bedtime Vit D3 1000-2000 IU daily Aspirin 81 mg daily with food Optional: Famotidine 20mg  daily Also can add tylenol/ibuprofen as needed  for fevers and body aches May add Mucinex or Mucinex DM as needed for cough/congestion  10 Things You Can Do to Manage Your COVID-19 Symptoms at Home If you have possible or confirmed COVID-19: 1. Stay home except to get medical care. 2. Monitor your symptoms carefully. If your symptoms get worse, call your healthcare provider immediately. 3. Get rest and stay hydrated. 4. If you have a medical appointment, call the healthcare provider ahead of time and tell them  that you have or may have COVID-19. 5. For medical emergencies, call 911 and notify the dispatch personnel that you have or may have COVID-19. 6. Cover your cough and sneezes with a tissue or use the inside of your elbow. 7. Wash your hands often with soap and water for at least 20 seconds or clean your hands with an alcohol-based hand sanitizer that contains at least 60% alcohol. 8. As much as possible, stay in a specific room and away from other people in your home. Also, you should use a separate bathroom, if available. If you need to be around other people in or outside of the home, wear a mask. 9. Avoid sharing personal items with other people in your household, like dishes, towels, and bedding. 10. Clean all surfaces that are touched often, like counters, tabletops, and doorknobs. Use household cleaning sprays or wipes according to the label instructions. [email or web address] 07/04/2020 This information is not intended to replace advice given to you by your health care provider. Make sure you discuss any questions you have with your health care provider. Document Revised: 10/20/2020 Document Reviewed: 10/20/2020 Elsevier Patient Education  2021 Elsevier Inc.   COVID-19: What to Do if You Are Sick If you have a fever, cough or other symptoms, you might have COVID-19. Most people have mild illness and are able to recover at home. If you are sick:  Keep track of your symptoms.  If you have an emergency warning sign (including trouble breathing), call 911. Steps to help prevent the spread of COVID-19 if you are sick If you are sick with COVID-19 or think you might have COVID-19, follow the steps below to care for yourself and to help protect other people in your home and community. Stay home except to get medical care  Stay home. Most people with COVID-19 have mild illness and can recover at home without medical care. Do not leave your home, except to get medical care. Do not visit public  areas.  Take care of yourself. Get rest and stay hydrated. Take over-the-counter medicines, such as acetaminophen, to help you feel better.  Stay in touch with your doctor. Call before you get medical care. Be sure to get care if you have trouble breathing, or have any other emergency warning signs, or if you think it is an emergency.  Avoid public transportation, ride-sharing, or taxis. Separate yourself from other people As much as possible, stay in a specific room and away from other people and pets in your home. If possible, you should use a separate bathroom. If you need to be around other people or animals in or outside of the home, wear a mask. Tell your close contactsthat they may have been exposed to COVID-19. An infected person can spread COVID-19 starting 48 hours (or 2 days) before the person has any symptoms or tests positive. By letting your close contacts know they may have been exposed to COVID-19, you are helping to protect everyone.  Additional guidance is available for those living in close  quarters and shared housing.  See COVID-19 and Animals if you have questions about pets.  If you are diagnosed with COVID-19, someone from the health department may call you. Answer the call to slow the spread. Monitor your symptoms  Symptoms of COVID-19 include fever, cough, or other symptoms.  Follow care instructions from your healthcare provider and local health department. Your local health authorities may give instructions on checking your symptoms and reporting information. When to seek emergency medical attention Look for emergency warning signs* for COVID-19. If someone is showing any of these signs, seek emergency medical care immediately:  Trouble breathing  Persistent pain or pressure in the chest  New confusion  Inability to wake or stay awake  Pale, gray, or blue-colored skin, lips, or nail beds, depending on skin tone *This list is not all possible symptoms.  Please call your medical provider for any other symptoms that are severe or concerning to you. Call 911 or call ahead to your local emergency facility: Notify the operator that you are seeking care for someone who has or may have COVID-19. Call ahead before visiting your doctor  Call ahead. Many medical visits for routine care are being postponed or done by phone or telemedicine.  If you have a medical appointment that cannot be postponed, call your doctor's office, and tell them you have or may have COVID-19. This will help the office protect themselves and other patients. Get  tested  If you have symptoms of COVID-19, get tested. While waiting for test results, you stay away from others, including staying apart from those living in your household.  You can visit your state, tribal, local, and territorialhealth department's website to look for the latest local information on testing sites. If you are sick, wear a mask over your nose and mouth  You should wear a mask over your nose and mouth if you must be around other people or animals, including pets (even at home).  You don't need to wear the mask if you are alone. If you can't put on a mask (because of trouble breathing, for example), cover your coughs and sneezes in some other way. Try to stay at least 6 feet away from other people. This will help protect the people around you.  Masks should not be placed on young children under age 26 years, anyone who has trouble breathing, or anyone who is not able to remove the mask without help. Note: During the COVID-19 pandemic, medical grade facemasks are reserved for healthcare workers and some first responders. Cover your coughs and sneezes  Cover your mouth and nose with a tissue when you cough or sneeze.  Throw away used tissues in a lined trash can.  Immediately wash your hands with soap and water for at least 20 seconds. If soap and water are not available, clean your hands with an  alcohol-based hand sanitizer that contains at least 60% alcohol. Clean your hands often  Wash your hands often with soap and water for at least 20 seconds. This is especially important after blowing your nose, coughing, or sneezing; going to the bathroom; and before eating or preparing food.  Use hand sanitizer if soap and water are not available. Use an alcohol-based hand sanitizer with at least 60% alcohol, covering all surfaces of your hands and rubbing them together until they feel dry.  Soap and water are the best option, especially if hands are visibly dirty.  Avoid touching your eyes, nose, and mouth with unwashed hands.  Handwashing Tips Avoid sharing personal household items  Do not share dishes, drinking glasses, cups, eating utensils, towels, or bedding with other people in your home.  Wash these items thoroughly after using them with soap and water or put in the dishwasher. Clean all "high-touch" surfaces everyday  Clean and disinfect high-touch surfaces in your "sick room" and bathroom; wear disposable gloves. Let someone else clean and disinfect surfaces in common areas, but you should clean your bedroom and bathroom, if possible.  If a caregiver or other person needs to clean and disinfect a sick person's bedroom or bathroom, they should do so on an as-needed basis. The caregiver/other person should wear a mask and disposable gloves prior to cleaning. They should wait as long as possible after the person who is sick has used the bathroom before coming in to clean and use the bathroom. ? High-touch surfaces include phones, remote controls, counters, tabletops, doorknobs, bathroom fixtures, toilets, keyboards, tablets, and bedside tables.  Clean and disinfect areas that may have blood, stool, or body fluids on them.  Use household cleaners and disinfectants. Clean the area or item with soap and water or another detergent if it is dirty. Then, use a household disinfectant. ? Be  sure to follow the instructions on the label to ensure safe and effective use of the product. Many products recommend keeping the surface wet for several minutes to ensure germs are killed. Many also recommend precautions such as wearing gloves and making sure you have good ventilation during use of the product. ? Use a product from [employer] N: Disinfectants for Coronavirus (COVID-19). ? Complete Disinfection Guidance When you can be around others after being sick with COVID-19 Deciding when you can be around others is different for different situations. Find out when you can safely end home isolation. For any additional questions about your care, contact your healthcare provider or state or local health department. 03/05/2020 Content source: [facility] (NCIRD), Division of Viral Diseases This information is not intended to replace advice given to you by your health care provider. Make sure you discuss any questions you have with your health care provider. Document Revised: 10/20/2020 Document Reviewed: 10/20/2020 Elsevier Patient Education  2021 [employer]

## 2021-06-12 ENCOUNTER — Other Ambulatory Visit: Payer: Self-pay

## 2021-06-12 DIAGNOSIS — I779 Disorder of arteries and arterioles, unspecified: Secondary | ICD-10-CM

## 2021-06-12 DIAGNOSIS — E7849 Other hyperlipidemia: Secondary | ICD-10-CM

## 2021-06-12 DIAGNOSIS — I2583 Coronary atherosclerosis due to lipid rich plaque: Secondary | ICD-10-CM

## 2021-06-12 DIAGNOSIS — I2510 Atherosclerotic heart disease of native coronary artery without angina pectoris: Secondary | ICD-10-CM

## 2021-06-12 MED ORDER — ATORVASTATIN CALCIUM 80 MG PO TABS: ORAL_TABLET | ORAL | 0 refills | Status: DC

## 2021-06-16 ENCOUNTER — Other Ambulatory Visit: Payer: Self-pay

## 2021-06-16 ENCOUNTER — Ambulatory Visit (HOSPITAL_COMMUNITY)
Admission: RE | Admit: 2021-06-16 | Discharge: 2021-06-16 | Disposition: A | Discharge status: Home / Self Care | Payer: Medicare Other | Source: Ambulatory Visit | Attending: Internal Medicine | Admitting: Internal Medicine

## 2021-06-16 DIAGNOSIS — I779 Disorder of arteries and arterioles, unspecified: Secondary | ICD-10-CM | POA: Insufficient documentation

## 2021-06-16 DIAGNOSIS — I6523 Occlusion and stenosis of bilateral carotid arteries: Secondary | ICD-10-CM | POA: Diagnosis not present

## 2021-06-17 ENCOUNTER — Other Ambulatory Visit (HOSPITAL_COMMUNITY): Payer: Self-pay | Admitting: Cardiology

## 2021-06-17 ENCOUNTER — Telehealth: Payer: Self-pay | Admitting: *Deleted

## 2021-06-17 DIAGNOSIS — I779 Disorder of arteries and arterioles, unspecified: Secondary | ICD-10-CM

## 2021-06-17 DIAGNOSIS — I6523 Occlusion and stenosis of bilateral carotid arteries: Secondary | ICD-10-CM

## 2021-06-17 NOTE — Telephone Encounter (Signed)
Pt aware of carotid artery results and recommendations per Dr. Shari Prows. Pt states he is completely asymptomatic and feels great.  Advised the pt to continue his current med regimen and follow-up as planned with [place] in July. Informed the pt that I will go ahead and place the order for his repeat carotids to be done in one year, and send a message to our PV Scheduler to call him back and arrange this now or closer to that time. Pt verbalized understanding and agrees with this plan.

## 2021-06-17 NOTE — Telephone Encounter (Signed)
-----   Message from Meriam Sprague, MD sent at 06/17/2021  7:17 AM EDT ----- His carotid ultrasound was consistent with prior. There is 60-79% narrowing on the right carotid and 50-59% narrowing on the left. We continue to manage with medication for now unless he is having symptoms. If asymptomatic, will plan for repeat testing in 1 year.

## 2021-06-25 ENCOUNTER — Telehealth: Payer: Self-pay | Admitting: *Deleted

## 2021-06-25 NOTE — Progress Notes (Deleted)
Cardiology Office Note:    Date:  06/25/2021   ID:  Scott Marshall, DOB 1951/10/20, MRN [id number]  PCP:  Shade Flood, MD   [facility] Providers Cardiologist:  Tobias Alexander, MD (Inactive) {     Referring MD: Shade Flood, MD    History of Present Illness:    Scott Marshall is a 70 y.o. male with a hx of CAD s/p CABG, HLD, and carotid artery disease who was previously followed by Dr. Delton See who now presents to clinic for follow-up.  Last saw Dr. Delton See on 06/2020 where he was doing very well. Active without anginal symptoms.  Carotid ultrasound that shows stable 60 to 79% stenosis on the right carotid artery and 40 to 59% stenosis on the left carotid artery without symptoms.  Today,  Past Medical History:  Diagnosis Date   CAD (coronary artery disease)    October, 2012,, catheterization done, CABG   Carotid artery disease (HCC)    Doppler, October, 2012, 60-79% R. ICA   Dyslipidemia    Ejection fraction     EF 65%, catheterization, October, 2012   Fatigue    Hx of CABG    October, 2012, Dr. Dorris Fetch.    Shortness of breath    October, 2012, This was an anginal equivalent    Past Surgical History:  Procedure Laterality Date   CORONARY ARTERY BYPASS GRAFT  09/2011   TONSILLECTOMY      Current Medications: No outpatient medications have been marked as taking for the 06/30/21 encounter (Appointment) with Meriam Sprague, MD.     Allergies:   Patient has no known allergies.   Social History   Socioeconomic History   Marital status: Married    Spouse name: Not on file   Number of children: 0   Years of education: Not on file   Highest education level: Not on file  Occupational History   Occupation: management @ [employer]  Tobacco Use   Smoking status: Former    Pack years: 0.00    Types: Cigarettes    Quit date: 12/20/1984    Years since quitting: 36.5   Smokeless tobacco: Never  Substance and Sexual Activity   Alcohol use: Yes     Comment: 2-3 a year maybe   Drug use: No   Sexual activity: Not on file  Other Topics Concern   Not on file  Social History Narrative   Not on file   Social Determinants of Health   Financial Resource Strain: Not on file  Food Insecurity: Not on file  Transportation Needs: Not on file  Physical Activity: Not on file  Stress: Not on file  Social Connections: Not on file     Family History: The patient's ***family history includes Coronary artery disease in his unknown relative; Dementia in his mother; Heart attack (age of onset: 8) in his father; Hyperlipidemia in his brother; Hypertension in his unknown relative; Stroke in his mother; Thyroid disease in his mother.  ROS:   Please see the history of present illness.    *** All other systems reviewed and are negative.  EKGs/Labs/Other Studies Reviewed:    The following studies were reviewed today: Carotid Ultrasound 06/16/21: Summary:  Right Carotid: Velocities in the right ICA are consistent with a 60-79%                 stenosis. Non-hemodynamically significant plaque <50% noted  in  the CCA. Stable RICA velocities.   Left Carotid: Velocities in the left ICA are consistent with a 40-59%  stenosis.                Non-hemodynamically significant plaque <50% noted in the  CCA.                Stable LICA velocities; stenosis based on PSV and plaque                formation, low end range.   Vertebrals:  Bilateral vertebral arteries demonstrate antegrade flow.  Subclavians: Normal flow hemodynamics were seen in bilateral subclavian               arteries.   EKG:  EKG is *** ordered today.  The ekg ordered today demonstrates ***  Recent Labs: No results found for requested labs within last 8760 hours.  Recent Lipid Panel    Component Value Date/Time   CHOL 164 06/20/2020 0910   TRIG 86 06/20/2020 0910   HDL 41 06/20/2020 0910   CHOLHDL 4.0 06/20/2020 0910   CHOLHDL 4.9 12/10/2015 0929   VLDL 11  12/10/2015 0929   LDLCALC 107 (H) 06/20/2020 0910     Risk Assessment/Calculations:   {Does this patient have ATRIAL FIBRILLATION?:[phone]}       Physical Exam:    VS:  There were no vitals taken for this visit.    Wt Readings from Last 3 Encounters:  06/20/20 176 lb (79.8 kg)  06/11/19 186 lb 1.9 oz (84.4 kg)  03/24/18 182 lb 6.4 oz (82.7 kg)     GEN: *** Well nourished, well developed in no acute distress HEENT: Normal NECK: No JVD; No carotid bruits LYMPHATICS: No lymphadenopathy CARDIAC: ***RRR, no murmurs, rubs, gallops RESPIRATORY:  Clear to auscultation without rales, wheezing or rhonchi  ABDOMEN: Soft, non-tender, non-distended MUSCULOSKELETAL:  No edema; No deformity  SKIN: Warm and dry NEUROLOGIC:  Alert and oriented x 3 PSYCHIATRIC:  Normal affect   ASSESSMENT:    No diagnosis found. PLAN:    In order of problems listed above:  #CAD s/p CABG: Doing very well without anginal symptoms. Remains active. -Continue ASA 81mg  daily -Continue lipitor 80mg  daily  #Carotid artery stenosis: Stable 40-59% on left and 60-70% on the right without symptoms. -Continue ASA 81mg  daily -Continue lipitor 80mg  daily -Needs repeat carotid in 1 year (05/2022)  #White coat HTN: Controlled at home.  #HLD: -Continue lipitor 80mg  daily  {Are you ordering a CV Procedure (e.g. stress test, cath, DCCV, TEE, etc)?   Press F2        :    Medication Adjustments/Labs and Tests Ordered: Current medicines are reviewed at length with the patient today.  Concerns regarding medicines are outlined above.  No orders of the defined types were placed in this encounter.  No orders of the defined types were placed in this encounter.   There are no Patient Instructions on file for this visit.   Signed, , MD  06/25/2021 3:06 PM    [facility]

## 2021-06-25 NOTE — Telephone Encounter (Signed)
-----   Message from Scarlette Ar sent at 06/19/2021 10:15 AM EDT ----- Regarding: RE: sched repeat carotids in one year per Shari Prows I have called Mr. Stormont and he wants to wait to schedule next year close to the time he is due. ----- Message ----- From: Loa Socks, LPN Sent: 01/07/1477   9:59 AM EDT To: Scarlette Ar, [facility] Subject: sched repeat carotids in one year per Pember#  Dr. Shari Prows would like for you to go ahead and schedule this pt to have a repeat carotid duplex done in one year.  Order is in and pt is aware you will call to arrange.  Can you please schedule and shoot me the date?  Thanks for all you do. Lajoyce Corners

## 2021-06-30 ENCOUNTER — Ambulatory Visit: Payer: Medicare Other | Admitting: Cardiology

## 2021-06-30 ENCOUNTER — Other Ambulatory Visit: Payer: Self-pay

## 2021-06-30 ENCOUNTER — Encounter: Payer: Self-pay | Admitting: Cardiology

## 2021-06-30 ENCOUNTER — Other Ambulatory Visit: Payer: Medicare Other | Admitting: *Deleted

## 2021-06-30 VITALS — BP 126/82 | HR 54 | Ht 67.0 in | Wt 179.2 lb

## 2021-06-30 DIAGNOSIS — Z951 Presence of aortocoronary bypass graft: Secondary | ICD-10-CM

## 2021-06-30 DIAGNOSIS — I2583 Coronary atherosclerosis due to lipid rich plaque: Secondary | ICD-10-CM | POA: Diagnosis not present

## 2021-06-30 DIAGNOSIS — E785 Hyperlipidemia, unspecified: Secondary | ICD-10-CM

## 2021-06-30 DIAGNOSIS — I6523 Occlusion and stenosis of bilateral carotid arteries: Secondary | ICD-10-CM

## 2021-06-30 DIAGNOSIS — I2510 Atherosclerotic heart disease of native coronary artery without angina pectoris: Secondary | ICD-10-CM

## 2021-06-30 DIAGNOSIS — E7849 Other hyperlipidemia: Secondary | ICD-10-CM

## 2021-06-30 DIAGNOSIS — Z79899 Other long term (current) drug therapy: Secondary | ICD-10-CM

## 2021-06-30 NOTE — Patient Instructions (Signed)

## 2021-06-30 NOTE — Progress Notes (Signed)
Cardiology Office Note:    Date:  06/30/2021   ID:  Scott Marshall, DOB [birth year], MRN [id number]  PCP:  Shade Flood, MD   [facility] Providers Cardiologist:  Tobias Alexander, MD (Inactive) {     Referring MD: Shade Flood, MD    History of Present Illness:    Scott Marshall is a 70 y.o. male with a hx of CAD s/p CABG, HLD, and carotid artery disease who was previously followed by Dr. Delton See who now presents to clinic for follow-up.  Last saw Dr. Delton See on 06/2020 where he was doing very well. Active without anginal symptoms.  Carotid ultrasound that shows stable 60 to 79% stenosis on the right carotid artery and 40 to 59% stenosis on the left carotid artery without symptoms.  Today, he is feeling great overall. In 2012 he had a CABG, and he denies any recurrent angina or other complications. He presents a blood pressure log. On average his at home blood pressure ranges in the 110s/60s-70s, and his heart rate is typically in the 50s.  For activity, he walks about 90 min a day for 5-6 days a week. He walks several dogs for 40-45 minutes a day, and then walks again later for another 40-45 minutes by himself. 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  He denies any chest pain, shortness of breath, palpitations, or exertional symptoms. No headaches, lightheadedness, or syncope to report. Also has no lower extremity edema, orthopnea or PND. Tolerating all medications without issues.   Past Medical History:  Diagnosis Date   CAD (coronary artery disease)    October, 2012,, catheterization done, CABG   Carotid artery disease (HCC)    Doppler, October, 2012, 60-79% R. ICA   Dyslipidemia    Ejection fraction     EF 65%, catheterization, October, 2012   Fatigue    Hx of CABG    October, 2012, Dr. Dorris Fetch.    Shortness of breath     October, 2012, This was an anginal equivalent    Past Surgical History:  Procedure Laterality Date   CORONARY ARTERY BYPASS GRAFT  09/2011   TONSILLECTOMY      Current Medications: Current Meds  Medication Sig   aspirin 81 MG tablet Take 81 mg by mouth daily.   atorvastatin (LIPITOR) 80 MG tablet TAKE 1 TABLET BY MOUTH EVERY DAY. Please keep upcoming appt in July 2022 with Dr. Shari Prows before anymore refills. Thank you     Allergies:   Patient has no known allergies.   Social History   Socioeconomic History   Marital status: Married    Spouse name: Not on file   Number of children: 0   Years of education: Not on file   Highest education level: Not on file  Occupational History   Occupation: management @ [employer]  Tobacco Use   Smoking status: Former    Pack years: 0.00    Types: Cigarettes    Quit date: 12/20/1984    Years since quitting: 36.5   Smokeless tobacco: Never  Substance and Sexual Activity   Alcohol use: Yes    Comment: 2-3 a year maybe   Drug use: No   Sexual activity: Not on file  Other Topics Concern   Not on file  Social History Narrative   Not on file   Social  Determinants of Health   Financial Resource Strain: Not on file  Food Insecurity: Not on file  Transportation Needs: Not on file  Physical Activity: Not on file  Stress: Not on file  Social Connections: Not on file     Family History: The patient's family history includes Coronary artery disease in his unknown relative; Dementia in his mother; Heart attack (age of onset: 70) in his father; Hyperlipidemia in his brother; Hypertension in his unknown relative; Stroke in his mother; Thyroid disease in his mother.  ROS:   Review of Systems  Constitutional:  Negative for diaphoresis and weight loss.  HENT:  Negative for hearing loss and nosebleeds.   Eyes:  Negative for photophobia and redness.  Respiratory:  Negative for cough, shortness of breath and stridor.   Cardiovascular:   Negative for chest pain, palpitations, orthopnea, claudication, leg swelling and PND.  Gastrointestinal:  Negative for abdominal pain and blood in stool.  Genitourinary:  Negative for flank pain and frequency.  Musculoskeletal:  Negative for back pain and falls.  Skin:  Negative for rash.  Neurological:  Negative for tingling and weakness.  Endo/Heme/Allergies:  Negative for polydipsia.  Psychiatric/Behavioral:  Negative for suicidal ideas. The patient is not nervous/anxious.     EKGs/Labs/Other Studies Reviewed:    The following studies were reviewed today:  Carotid Ultrasound 06/16/21: Summary:  Right Carotid: Velocities in the right ICA are consistent with a 60-79%                 stenosis. Non-hemodynamically significant plaque <50% noted                 In the CCA. Stable RICA velocities.   Left Carotid: Velocities in the left ICA are consistent with a 40-59%  stenosis. Non-hemodynamically significant plaque <50% noted in the  CCA. Stable LICA velocities; stenosis based on PSV and plaque formation, low end range.   Vertebrals:  Bilateral vertebral arteries demonstrate antegrade flow.  Subclavians: Normal flow hemodynamics were seen in bilateral subclavian arteries.   EKG:   06/30/2021: Sinus bradycardia, HR 54 bpm, RBBB  Recent Labs: No results found for requested labs within last 8760 hours.  Recent Lipid Panel    Component Value Date/Time   CHOL 164 06/20/2020 0910   TRIG 86 06/20/2020 0910   HDL 41 06/20/2020 0910   CHOLHDL 4.0 06/20/2020 0910   CHOLHDL 4.9 12/10/2015 0929   VLDL 11 12/10/2015 0929   LDLCALC 107 (H) 06/20/2020 0910         Physical Exam:    VS:  BP 126/82   Pulse (!) 54   Ht 5\' 7"  (1.702 m)   Wt 179 lb 3.2 oz (81.3 kg)   SpO2 96%   BMI 28.07 kg/m     Wt Readings from Last 3 Encounters:  06/30/21 179 lb 3.2 oz (81.3 kg)  06/20/20 176 lb (79.8 kg)  06/11/19 186 lb 1.9 oz (84.4 kg)     GEN: Well nourished, well developed in no acute  distress HEENT: Normal NECK: No JVD; +right carotid bruit CARDIAC: Regular rhythm, bradycardic, no murmurs, rubs, gallops RESPIRATORY:  Clear to auscultation without rales, wheezing or rhonchi  ABDOMEN: Soft, non-tender, non-distended MUSCULOSKELETAL:  No edema; No deformity  SKIN: Warm and dry NEUROLOGIC:  Alert and oriented x 3 PSYCHIATRIC:  Normal affect   ASSESSMENT:    1. Coronary artery disease due to lipid rich plaque   2. Other hyperlipidemia   3. Bilateral carotid artery stenosis  4. Hx of CABG    PLAN:    In order of problems listed above:  #CAD s/p CABG in 2012: Doing very well without anginal symptoms. Remains active. -Continue ASA 81mg  daily -Continue lipitor 80mg  daily  #Carotid artery stenosis: Stable 40-59% on left and 60-70% on the right without symptoms. -Continue ASA 81mg  daily -Continue lipitor 80mg  daily -Needs repeat carotid in 1 year (05/2022)  #White coat HTN: Controlled at home mainly running 110/60s.   #HLD: -Continue lipitor 80mg  daily -Repeat cholesterol today     Follow-up in 1 year.  Medication Adjustments/Labs and Tests Ordered: Current medicines are reviewed at length with the patient today.  Concerns regarding medicines are outlined above.  Orders Placed This Encounter  Procedures   EKG 12-Lead   No orders of the defined types were placed in this encounter.   Patient Instructions  Medication Instructions:   Your physician recommends that you continue on your current medications as directed. Please refer to the Current Medication list given to you today.  *If you need a refill on your cardiac medications before your next appointment, please call your pharmacy*   Follow-Up: At [facility], you and your health needs are our priority.  As part of our continuing mission to provide you with exceptional heart care, we have created designated Provider Care Teams.  These Care Teams include your primary Cardiologist (physician)  and Advanced Practice Providers (APPs -  Physician Assistants and Nurse Practitioners) who all work together to provide you with the care you need, when you need it.  We recommend signing up for the patient portal called "MyChart".  Sign up information is provided on this After Visit Summary.  MyChart is used to connect with patients for Virtual Visits (Telemedicine).  Patients are able to view lab/test results, encounter notes, upcoming appointments, etc.  Non-urgent messages can be sent to your provider as well.   To learn more about what you can do with MyChart, go to .    Your next appointment:   1 year(s)  The format for your next appointment:   In Person  Provider:   , MD      [facility] Stumpf,acting as a scribe for , MD.,have documented all relevant documentation on the behalf of [facility], MD,as directed by  [email or web address], MD while in the presence of Scott Flatten, MD.  I, Scott [facility], MD, have reviewed all documentation for this visit. The documentation on 06/30/21 for the exam, diagnosis, procedures, and orders are all accurate and complete.   Signed, Meriam Sprague, MD  06/30/2021 10:30 AM    [facility]

## 2021-07-02 ENCOUNTER — Telehealth: Payer: Self-pay | Admitting: *Deleted

## 2021-07-02 DIAGNOSIS — I2583 Coronary atherosclerosis due to lipid rich plaque: Secondary | ICD-10-CM

## 2021-07-02 DIAGNOSIS — E7849 Other hyperlipidemia: Secondary | ICD-10-CM

## 2021-07-02 DIAGNOSIS — I2510 Atherosclerotic heart disease of native coronary artery without angina pectoris: Secondary | ICD-10-CM

## 2021-07-02 DIAGNOSIS — Z79899 Other long term (current) drug therapy: Secondary | ICD-10-CM

## 2021-07-02 DIAGNOSIS — I779 Disorder of arteries and arterioles, unspecified: Secondary | ICD-10-CM

## 2021-07-02 DIAGNOSIS — Z951 Presence of aortocoronary bypass graft: Secondary | ICD-10-CM

## 2021-07-02 MED ORDER — ATORVASTATIN CALCIUM 80 MG PO TABS: ORAL_TABLET | ORAL | 3 refills | Status: DC

## 2021-07-02 MED ORDER — EZETIMIBE 10 MG PO TABS: 10.0000 mg | ORAL_TABLET | Freq: Every day | ORAL | 2 refills | Status: DC

## 2021-07-02 NOTE — Telephone Encounter (Signed)
-----   Message from Meriam Sprague, MD sent at 07/01/2021  1:48 PM EDT ----- We can wait to talk to him once the T3/T4 are run but his cholesterol is also above goal. If he is willing, would like to start zetia 10mg  daily to get his LDL<70. Repeat labs in 6weeks to ensure responding. Otherwise, kidney function, electrolytes, blood counts look stable.

## 2021-07-02 NOTE — Telephone Encounter (Signed)
Meriam Sprague, MD  07/01/2021  1:48 PM EDT      We can wait to talk to him once the T3/T4 are run but his cholesterol is also above goal. If he is willing, would like to start zetia 10mg  daily to get his LDL<70. Repeat labs in 6weeks to ensure responding. Otherwise, kidney function,electrolytes, blood counts look stable.    , LPN  [place] 0/17/4944 AM EDT      Spoke with Dr. 96:75 about this pts abnormal TSH level.  Per Dr. Shari Prows should add on Free T3 and Free T4 to pts existing labs at [facility]. Was able to call [facility] and add on both Free T3 and Free T4 to pts specimen from yesterday.  Per [facility], they will run this now, and fax over the resultsto our office at [phone] once complete.

## 2021-07-02 NOTE — Telephone Encounter (Signed)
Pt aware of lab results and recommendations per Dr. Shari Prows. Pt aware to start taking zetia 10 mg po daily and come in for recheck of lipids in 6 weeks.  Confirmed the pharmacy of choice with the pt.  Scheduled the pt for repeat lipids in 6 weeks on 08/10/21.  He is aware to come fasting.  Pt verbalized understanding and agrees with this plan.

## 2021-07-03 LAB — COMPREHENSIVE METABOLIC PANEL
ALT: 29 IU/L (ref 0–44)
AST: 28 IU/L (ref 0–40)
Albumin/Globulin Ratio: 2.1 (ref 1.2–2.2)
Albumin: 4.5 g/dL (ref 3.8–4.8)
Alkaline Phosphatase: 92 IU/L (ref 44–121)
BUN/Creatinine Ratio: 14 (ref 10–24)
BUN: 16 mg/dL (ref 8–27)
Bilirubin Total: 0.7 mg/dL (ref 0.0–1.2)
CO2: 21 mmol/L (ref 20–29)
Calcium: 9 mg/dL (ref 8.6–10.2)
Chloride: 131 mmol/L (ref 96–106)
Creatinine, Ser: 1.16 mg/dL (ref 0.76–1.27)
Globulin, Total: 2.1 g/dL (ref 1.5–4.5)
Glucose: 104 mg/dL — ABNORMAL HIGH (ref 65–99)
Potassium: 5.2 mmol/L (ref 3.5–5.2)
Total Protein: 6.6 g/dL (ref 6.0–8.5)
eGFR: 69 mL/min/{1.73_m2} (ref >59)

## 2021-07-03 LAB — CBC
Hematocrit: 43.9 % (ref 37.5–51.0)
Hemoglobin: 14.6 g/dL (ref 13.0–17.7)
MCH: 31.6 pg (ref 26.6–33.0)
MCHC: 33.3 g/dL (ref 31.5–35.7)
MCV: 95 fL (ref 79–97)
Platelets: 118 10*3/uL — ABNORMAL LOW (ref 150–450)
RBC: 4.62 x10E6/uL (ref 4.14–5.80)
RDW: 13.7 % (ref 11.6–15.4)
WBC: 6.8 10*3/uL (ref 3.4–10.8)

## 2021-07-03 LAB — LIPID PANEL
Chol/HDL Ratio: 4 ratio (ref 0.0–5.0)
Cholesterol, Total: 166 mg/dL (ref 100–199)
HDL: 41 mg/dL (ref >39)
LDL Chol Calc (NIH): 107 mg/dL — ABNORMAL HIGH (ref 0–99)
Triglycerides: 96 mg/dL (ref 0–149)
VLDL Cholesterol Cal: 18 mg/dL (ref 5–40)

## 2021-07-03 LAB — TSH: TSH: 12.6 u[IU]/mL — ABNORMAL HIGH (ref 0.450–4.500)

## 2021-07-08 LAB — SPECIMEN STATUS REPORT

## 2021-07-08 LAB — T3, FREE: T3, Free: 3.4 pg/mL (ref 2.0–4.4)

## 2021-07-08 LAB — T4, FREE: Free T4: 1.13 ng/dL (ref 0.82–1.77)

## 2021-08-10 ENCOUNTER — Other Ambulatory Visit: Payer: Medicare Other | Admitting: *Deleted

## 2021-08-10 ENCOUNTER — Other Ambulatory Visit: Payer: Self-pay

## 2021-08-10 DIAGNOSIS — I2510 Atherosclerotic heart disease of native coronary artery without angina pectoris: Secondary | ICD-10-CM

## 2021-08-10 DIAGNOSIS — Z79899 Other long term (current) drug therapy: Secondary | ICD-10-CM

## 2021-08-10 DIAGNOSIS — I2583 Coronary atherosclerosis due to lipid rich plaque: Secondary | ICD-10-CM

## 2021-08-10 DIAGNOSIS — Z951 Presence of aortocoronary bypass graft: Secondary | ICD-10-CM

## 2021-08-10 DIAGNOSIS — I779 Disorder of arteries and arterioles, unspecified: Secondary | ICD-10-CM

## 2021-08-10 DIAGNOSIS — E7849 Other hyperlipidemia: Secondary | ICD-10-CM

## 2021-08-10 LAB — LIPID PANEL
Chol/HDL Ratio: 3.6 ratio (ref 0.0–5.0)
Cholesterol, Total: 142 mg/dL (ref 100–199)
HDL: 39 mg/dL — ABNORMAL LOW (ref >39)
LDL Chol Calc (NIH): 87 mg/dL (ref 0–99)
Triglycerides: 82 mg/dL (ref 0–149)
VLDL Cholesterol Cal: 16 mg/dL (ref 5–40)

## 2021-08-11 ENCOUNTER — Telehealth: Payer: Self-pay | Admitting: Cardiology

## 2021-08-11 NOTE — Telephone Encounter (Signed)
Spoke with pt and reviewed recommendations.  Scheduled pt to come in and see Pharmacy team 8/30.  Pt appreciative for call.

## 2021-08-11 NOTE — Telephone Encounter (Signed)
Patient is returning call to discuss lab results. 

## 2021-08-17 NOTE — Progress Notes (Signed)
Patient ID: Scott Marshall                 DOB: [birth year]                    MRN: [id number]     HPI: Scott Marshall is a 70 y.o. male patient referred to lipid clinic by Dr. Shari Prows. PMH is significant for CAD s/p CABG in 2012 (age 39), HLD, carotid artery disease (carotid doppler 05/2021 shows stable 60 to 79% stenosis on the right carotid artery and 40 to 59% stenosis on the left carotid artery without symptoms). Ezetimibe was added after last visit with Dr. Shari Prows, which decreased LDL from 107 to 87 after 6 weeks (19% decrease) but still not at goal and was referred to lipid clinic for PCSK9i.   Today, patient reports compliance with atorvastatin and ezetimibe with no adverse effects reported. He does not like to be on medications. He previously took Crestor after his CABG for a few years. He did not have any issues tolerating this but switched to atorvastatin due to cost at the time. He has tolerating atorvastatin well since then. States he has a healthy lifestyle with diet and exercise but that his family history has caught up with him.   Current Medications: atorvastatin 80mg  daily, ezetimibe 10mg  daily Intolerances: None (previously tried rosuvastatin, switched to atorvastatin due to cost in 2014) Risk Factors: CABG, Fhx ASCVD LDL goal: <70 mg/dL  Diet: eats a heart-healthy diet  Exercise: He walks about 90 min a day for 5-6 days a week. He walks several dogs for 40-45 minutes a day, and then walks again later for another 40-45 minutes by himself. Also, he enjoys playing golf, doing yard work, and is a . 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  Family History: Coronary artery disease in his unknown relative; Dementia in his mother; Heart attack (age of onset: 71) in his father; Hyperlipidemia in his brother; Hypertension in his unknown relative; Stroke in his mother; Thyroid disease in his mother  Social  History: Former smoker (quit 1986)  Labs: 08/10/21: TC 142, TG 82, HDL 39, LDL 87 (atorvastatin 80mg , ezetimibe 10mg ) 06/30/21: TC 166, TG 96, HDL 41, LDL 107 (atorvastatin 80mg )  Past Medical History:  Diagnosis Date   CAD (coronary artery disease)    October, 2012,, catheterization done, CABG   Carotid artery disease (HCC)    Doppler, October, 2012, 60-79% R. ICA   Dyslipidemia    Ejection fraction     EF 65%, catheterization, October, 2012   Fatigue    Hx of CABG    October, 2012, Dr. .    Shortness of breath    October, 2012, This was an anginal equivalent    Current Outpatient Medications on File Prior to Visit  Medication Sig Dispense Refill   aspirin 81 MG tablet Take 81 mg by mouth daily.     atorvastatin (LIPITOR) 80 MG tablet TAKE 1 TABLET BY MOUTH EVERY DAY. 90 tablet 3   ezetimibe (ZETIA) 10 MG tablet Take 1 tablet (10 mg total) by mouth daily. 90 tablet 2   No current facility-administered medications on file prior to visit.    No Known Allergies  Assessment/Plan:  1. Hyperlipidemia - LDL of 87 not at goal <70 mg/dL. LDL improved 19% from 107 to 87 after taking ezetimibe now for about 5 weeks. He is adherent to both  ezetimibe 10mg  daily and atorvastatin 80mg  daily which I will continue. Discussed that with previous CABG and family history of ASCVD, goal LDL is 70 mg/dL and we need to add another medication to bring this to goal, given that there is not much room for LDL improvement with his lifestyle alone, since he is already very healthy with diet and exercise. PCSK9i is needed as this would lower LDL by ~60% and bring to goal. Taught patient proper administration technique and storage. Discussed LDL lowering and cost. Repatha and Praluent are both tier 3 medication that require a PA, cost is $47 for a 30 day supply, $131 for a 90 day supply. He is hesitant to start another medication and would like time to think about whether he wants to start PCSK9i  therapy. Discussed benefits of treatment and encouraged him to do his own research on these medications but at the end of the day, it is his decision whether he wants to take this medication. Provided him with pharmacist office phone number and let him know I would call him in a week if I have not heard from him yet.   , PharmD PGY2 Ambulatory Care Pharmacy Resident 08/18/2021 11:25 AM

## 2021-08-18 ENCOUNTER — Other Ambulatory Visit: Payer: Self-pay

## 2021-08-18 ENCOUNTER — Ambulatory Visit (INDEPENDENT_AMBULATORY_CARE_PROVIDER_SITE_OTHER): Payer: Medicare Other | Admitting: Student-PharmD

## 2021-08-18 DIAGNOSIS — E7849 Other hyperlipidemia: Secondary | ICD-10-CM | POA: Diagnosis not present

## 2021-08-18 NOTE — Patient Instructions (Signed)
Nice to see you today!  Keep up the good work with diet and exercise. Aim for a diet full of vegetables, fruit and lean meats (chicken, [place], fish). Try to limit carbs (bread, pasta, sugar, rice) and red meat consumption.  Your goal LDL is less than 70 mg/dL, you're currently at 87 mg/dL   Medication Changes: Continue atorvastatin 80 mg daily and ezetimibe 10 mg daily  The names of the injectable medications we discussed today are Repatha and Praluent. Your insurance covers both equally. The cost would be $47 per a 28 day supply. This medication is given every 14 days into the stomach. It lowest LDL cholesterol by ~60%.   Please give [place] a call at [phone] with any questions or concerns.

## 2021-08-25 ENCOUNTER — Telehealth: Payer: Self-pay | Admitting: Student-PharmD

## 2021-08-25 NOTE — Telephone Encounter (Addendum)
Called patient to follow up if he had made a decision regarding starting PCSK9i therapy, as he was likely uninterested in starting treatment but had requested time to think about this at last week's visit. Unable to reach patient, left voicemail requesting call back.   Will await patient's call back.

## 2021-09-22 ENCOUNTER — Other Ambulatory Visit: Payer: Self-pay | Admitting: *Deleted

## 2021-09-22 DIAGNOSIS — E7849 Other hyperlipidemia: Secondary | ICD-10-CM

## 2021-09-22 DIAGNOSIS — I2510 Atherosclerotic heart disease of native coronary artery without angina pectoris: Secondary | ICD-10-CM

## 2021-09-22 DIAGNOSIS — I779 Disorder of arteries and arterioles, unspecified: Secondary | ICD-10-CM

## 2021-09-22 MED ORDER — ATORVASTATIN CALCIUM 80 MG PO TABS: ORAL_TABLET | ORAL | 3 refills | Status: DC

## 2021-09-24 ENCOUNTER — Other Ambulatory Visit: Payer: Self-pay

## 2021-09-24 DIAGNOSIS — E7849 Other hyperlipidemia: Secondary | ICD-10-CM

## 2021-09-24 DIAGNOSIS — I2510 Atherosclerotic heart disease of native coronary artery without angina pectoris: Secondary | ICD-10-CM

## 2021-09-24 DIAGNOSIS — I779 Disorder of arteries and arterioles, unspecified: Secondary | ICD-10-CM

## 2021-09-24 MED ORDER — ATORVASTATIN CALCIUM 80 MG PO TABS: 80.0000 mg | ORAL_TABLET | Freq: Every day | ORAL | 2 refills | Status: DC

## 2021-09-24 NOTE — Telephone Encounter (Signed)
Pt's medication was sent to pt's pharmacy as requested. Confirmation received.  °

## 2022-03-26 ENCOUNTER — Other Ambulatory Visit: Payer: Self-pay | Admitting: *Deleted

## 2022-03-26 DIAGNOSIS — E7849 Other hyperlipidemia: Secondary | ICD-10-CM

## 2022-03-26 DIAGNOSIS — Z79899 Other long term (current) drug therapy: Secondary | ICD-10-CM

## 2022-03-26 DIAGNOSIS — I2510 Atherosclerotic heart disease of native coronary artery without angina pectoris: Secondary | ICD-10-CM

## 2022-03-26 DIAGNOSIS — Z951 Presence of aortocoronary bypass graft: Secondary | ICD-10-CM

## 2022-03-26 DIAGNOSIS — I779 Disorder of arteries and arterioles, unspecified: Secondary | ICD-10-CM

## 2022-03-26 MED ORDER — EZETIMIBE 10 MG PO TABS: 10.0000 mg | ORAL_TABLET | Freq: Every day | ORAL | 0 refills | Status: DC

## 2022-05-12 ENCOUNTER — Telehealth: Payer: Self-pay | Admitting: Cardiology

## 2022-05-12 DIAGNOSIS — R7989 Other specified abnormal findings of blood chemistry: Secondary | ICD-10-CM

## 2022-05-12 DIAGNOSIS — E7849 Other hyperlipidemia: Secondary | ICD-10-CM

## 2022-05-12 DIAGNOSIS — Z951 Presence of aortocoronary bypass graft: Secondary | ICD-10-CM

## 2022-05-12 DIAGNOSIS — I2510 Atherosclerotic heart disease of native coronary artery without angina pectoris: Secondary | ICD-10-CM

## 2022-05-12 DIAGNOSIS — Z79899 Other long term (current) drug therapy: Secondary | ICD-10-CM

## 2022-05-12 NOTE — Telephone Encounter (Signed)
Spoke with the pt.  Scheduled him for labs same day as he see's Dr. Shari Prows for follow-up on 07/02/22.  Pt is aware we will check his usual labs.  Lab orders to check cmet, tsh, free T3, Free T4, and lipids placed.  Pt is aware to come fasting to this lab appt.  Pt verbalized understanding and agrees with this plan.

## 2022-05-12 NOTE — Telephone Encounter (Signed)
Pt is requesting labs before his 1 y/r f/u with Dr Johney Frame on 07/02/22 @ 9:20 AM. Needing an appt made for labs.

## 2022-05-29 ENCOUNTER — Emergency Department (HOSPITAL_COMMUNITY): Payer: Medicare Other

## 2022-05-29 ENCOUNTER — Emergency Department (HOSPITAL_COMMUNITY)
Admission: EM | Admit: 2022-05-29 | Discharge: 2022-05-29 | Disposition: A | Discharge status: Home / Self Care | Payer: Medicare Other | Attending: Emergency Medicine | Admitting: Emergency Medicine

## 2022-05-29 ENCOUNTER — Other Ambulatory Visit: Payer: Self-pay

## 2022-05-29 ENCOUNTER — Encounter (HOSPITAL_COMMUNITY): Payer: Self-pay | Admitting: Emergency Medicine

## 2022-05-29 DIAGNOSIS — I2510 Atherosclerotic heart disease of native coronary artery without angina pectoris: Secondary | ICD-10-CM | POA: Insufficient documentation

## 2022-05-29 DIAGNOSIS — Z7982 Long term (current) use of aspirin: Secondary | ICD-10-CM | POA: Diagnosis not present

## 2022-05-29 DIAGNOSIS — R42 Dizziness and giddiness: Secondary | ICD-10-CM | POA: Diagnosis not present

## 2022-05-29 DIAGNOSIS — R531 Weakness: Secondary | ICD-10-CM | POA: Insufficient documentation

## 2022-05-29 DIAGNOSIS — Z951 Presence of aortocoronary bypass graft: Secondary | ICD-10-CM | POA: Diagnosis not present

## 2022-05-29 LAB — CBC WITH DIFFERENTIAL/PLATELET
Abs Immature Granulocytes: 0.02 10*3/uL (ref 0.00–0.07)
Basophils Absolute: 0.1 10*3/uL (ref 0.0–0.1)
Basophils Relative: 1 %
Eosinophils Absolute: 0.2 10*3/uL (ref 0.0–0.5)
Eosinophils Relative: 4 %
HCT: 45 % (ref 39.0–52.0)
Hemoglobin: 15.3 g/dL (ref 13.0–17.0)
Immature Granulocytes: 0 %
Lymphocytes Relative: 26 %
Lymphs Abs: 1.5 10*3/uL (ref 0.7–4.0)
MCH: 32.1 pg (ref 26.0–34.0)
MCHC: 34 g/dL (ref 30.0–36.0)
MCV: 94.3 fL (ref 80.0–100.0)
Monocytes Absolute: 0.7 10*3/uL (ref 0.1–1.0)
Monocytes Relative: 12 %
Neutro Abs: 3.3 10*3/uL (ref 1.7–7.7)
Neutrophils Relative %: 57 %
Platelets: 139 10*3/uL — ABNORMAL LOW (ref 150–400)
RBC: 4.77 MIL/uL (ref 4.22–5.81)
RDW: 12.9 % (ref 11.5–15.5)
WBC: 5.8 10*3/uL (ref 4.0–10.5)
nRBC: 0 % (ref 0.0–0.2)

## 2022-05-29 LAB — URINALYSIS, ROUTINE W REFLEX MICROSCOPIC
Bilirubin Urine: NEGATIVE
Glucose, UA: NEGATIVE mg/dL
Hgb urine dipstick: NEGATIVE
Ketones, ur: NEGATIVE mg/dL
Leukocytes,Ua: NEGATIVE
Nitrite: NEGATIVE
Protein, ur: NEGATIVE mg/dL
Specific Gravity, Urine: 1.009 (ref 1.005–1.030)
pH: 6 (ref 5.0–8.0)

## 2022-05-29 LAB — COMPREHENSIVE METABOLIC PANEL
ALT: 27 U/L (ref 0–44)
AST: 26 U/L (ref 15–41)
Albumin: 4.2 g/dL (ref 3.5–5.0)
Alkaline Phosphatase: 71 U/L (ref 38–126)
Anion gap: 4 — ABNORMAL LOW (ref 5–15)
BUN: 24 mg/dL — ABNORMAL HIGH (ref 8–23)
CO2: 25 mmol/L (ref 22–32)
Calcium: 8.9 mg/dL (ref 8.9–10.3)
Chloride: 113 mmol/L — ABNORMAL HIGH (ref 98–111)
Creatinine, Ser: 1.1 mg/dL (ref 0.61–1.24)
GFR, Estimated: >60 mL/min (ref >60)
Glucose, Bld: 113 mg/dL — ABNORMAL HIGH (ref 70–99)
Potassium: 4.2 mmol/L (ref 3.5–5.1)
Sodium: 142 mmol/L (ref 135–145)
Total Bilirubin: 1.2 mg/dL (ref 0.3–1.2)
Total Protein: 6.9 g/dL (ref 6.5–8.1)

## 2022-05-29 LAB — TROPONIN I (HIGH SENSITIVITY): Troponin I (High Sensitivity): 4 ng/L (ref <18)

## 2022-05-29 MED ORDER — IOHEXOL 350 MG/ML SOLN
75.0000 mL | Freq: Once | INTRAVENOUS | Status: AC | PRN
  Administered 2022-05-29: 75 mL via INTRAVENOUS

## 2022-05-29 MED ORDER — SODIUM CHLORIDE 0.9 % IV BOLUS
1000.0000 mL | Freq: Once | INTRAVENOUS | Status: AC
  Administered 2022-05-29: 1000 mL via INTRAVENOUS

## 2022-05-29 MED ORDER — SODIUM CHLORIDE (PF) 0.9 % IJ SOLN
INTRAMUSCULAR | Status: AC
  Filled 2022-05-29: qty 50

## 2022-05-29 NOTE — Discharge Instructions (Signed)
Your labs and imaging today were reassuring. Your CTA head and neck revealed 75% stenosis on the right side which is similar to you prior ultrasound results. At this point, I recommend that you follow up with your PCP regarding your symptoms. If you notice new or worsening symptoms such as persistent dizziness, balance issues, numbness or weakness of your arms or legs, chest pain, or shortness of breath, please return to the Emergency Department.

## 2022-05-29 NOTE — ED Triage Notes (Signed)
Patient reports waking up dizzy/lightheaded/ elevated bp. Denies pain in triage

## 2022-05-29 NOTE — ED Provider Notes (Signed)
[facility] DEPT Provider Note   CSN: [id number] Arrival date & time: 05/29/22  [id number]     History PMH: Carotid artery stenosis, CAD, hx of CABG (2012), HLD Chief Complaint  Patient presents with   Dizziness    Scott Marshall is a 71 y.o. male. Presents with lightheadedness.  Patient states that he awoke from bed this morning and got up and started feeling lightheadedness.  He he also feels generally weak and had some bilateral hand tingling when he woke up from bed this morning.  He says that his BP was higher than normal this morning with SBP in 130s. Says the symptoms have persisted.  He states that the only other time he had similar symptoms was in 2012 right before he had his CABG and was diagnosed with multiple blockages.  He said at that time, he did not have any chest pain either.  He denies any recent changes in medications. He has not had any different activities than usual. He states that he walks daily and walked yesterday but this is not unusual for him. He denies any fevers, chills, vertigo, headache, visual disturbance, chest pain, shortness of breath, back pain, nausea, vomiting, numbness, or weakness of extremities.    Dizziness Associated symptoms: weakness   Associated symptoms: no blood in stool, no chest pain, no headaches, no nausea, no shortness of breath and no vomiting        Home Medications Prior to Admission medications   Medication Sig Start Date End Date Taking? Authorizing Provider  aspirin 81 MG tablet Take 81 mg by mouth daily.   Yes [provider]  atorvastatin (LIPITOR) 80 MG tablet Take 1 tablet (80 mg total) by mouth daily. TAKE 1 TABLET BY MOUTH EVERY DAY. 09/24/21  Yes Freada Bergeron, MD  ezetimibe (ZETIA) 10 MG tablet Take 1 tablet (10 mg total) by mouth daily. 03/26/22  Yes Freada Bergeron, MD      Allergies    Patient has no known allergies.    Review of Systems   Review of Systems   Constitutional:  Positive for fatigue. Negative for activity change, appetite change, chills and fever.  HENT:  Negative for congestion and sore throat.   Eyes:  Negative for visual disturbance.  Respiratory:  Negative for shortness of breath.   Cardiovascular:  Negative for chest pain and leg swelling.  Gastrointestinal:  Negative for abdominal pain, blood in stool, nausea and vomiting.  Musculoskeletal:  Negative for gait problem.  Neurological:  Positive for weakness and light-headedness. Negative for dizziness, tremors, seizures, syncope, facial asymmetry, speech difficulty, numbness and headaches.       Tingling in bilateral upper extremities  Psychiatric/Behavioral:  Negative for confusion.   All other systems reviewed and are negative.   Physical Exam Updated Vital Signs BP (!) 143/71   Pulse (!) 58   Temp 98 F (36.7 C) (Oral)   Resp 12   Ht 5\' 7"  (1.702 m)   Wt 80.3 kg   SpO2 99%   BMI 27.72 kg/m  Physical Exam Vitals and nursing note reviewed.  Constitutional:      General: He is not in acute distress.    Appearance: Normal appearance. He is well-developed. He is not ill-appearing, toxic-appearing or diaphoretic.  HENT:     Head: Normocephalic and atraumatic.     Nose: Nose normal. No nasal deformity.     Mouth/Throat:     Lips: Pink. No lesions.  Mouth: Mucous membranes are moist.     Pharynx: Oropharynx is clear. No oropharyngeal exudate or posterior oropharyngeal erythema.  Eyes:     General: Gaze aligned appropriately. No scleral icterus.       Right eye: No discharge.        Left eye: No discharge.     Extraocular Movements: Extraocular movements intact.     Conjunctiva/sclera: Conjunctivae normal.     Right eye: Right conjunctiva is not injected. No exudate or hemorrhage.    Left eye: Left conjunctiva is not injected. No exudate or hemorrhage.    Pupils: Pupils are equal, round, and reactive to light.  Neck:     Vascular: No carotid bruit.   Cardiovascular:     Rate and Rhythm: Normal rate and regular rhythm.     Pulses: Normal pulses.     Heart sounds: Normal heart sounds. No murmur heard.    No friction rub. No gallop.  Pulmonary:     Effort: Pulmonary effort is normal. No respiratory distress.     Breath sounds: Normal breath sounds. No stridor. No wheezing, rhonchi or rales.  Abdominal:     General: Abdomen is flat. There is no distension.     Palpations: Abdomen is soft. There is no mass.     Tenderness: There is no abdominal tenderness. There is no right CVA tenderness, left CVA tenderness, guarding or rebound.  Musculoskeletal:     Right lower leg: No edema.     Left lower leg: No edema.  Skin:    General: Skin is warm and dry.     Coloration: Skin is not jaundiced.  Neurological:     Mental Status: He is alert and oriented to person, place, and time.     Comments: Alert and Oriented x 3 Speech clear with no aphasia PERRLA. EOM intact. No Nystagmus No facial asymmetry Motor: - 5/5 motor strength in all four extremities.  Sensation: - Grossly intact in all four extremities.     Psychiatric:        Mood and Affect: Mood normal.        Speech: Speech normal.        Behavior: Behavior normal. Behavior is cooperative.     ED Results / Procedures / Treatments   Labs (all labs ordered are listed, but only abnormal results are displayed) Labs Reviewed  COMPREHENSIVE METABOLIC PANEL - Abnormal; Notable for the following components:      Result Value   Chloride 113 (*)    Glucose, Bld 113 (*)    BUN 24 (*)    Anion gap 4 (*)    All other components within normal limits  CBC WITH DIFFERENTIAL/PLATELET - Abnormal; Notable for the following components:   Platelets 139 (*)    All other components within normal limits  URINALYSIS, ROUTINE W REFLEX MICROSCOPIC - Abnormal; Notable for the following components:   Color, Urine STRAW (*)    All other components within normal limits  TROPONIN I (HIGH  SENSITIVITY)  TROPONIN I (HIGH SENSITIVITY)    EKG EKG Interpretation  Date/Time:  Saturday May 29 2022 10:11:37 EDT Ventricular Rate:  55 PR Interval:  150 QRS Duration: 87 QT Interval:  444 QTC Calculation: 425 R Axis:   40 Text Interpretation: Sinus rhythm when compared to prior, similar sinus rhythm No STEMI Confirmed by Antony Blackbird [id number]) on 05/29/2022 12:15:39 PM  Radiology CT Angio Head W or Wo Contrast  Result Date: 05/29/2022 CLINICAL DATA:  Carotid artery  stenosis.  Dizziness. EXAM: CT ANGIOGRAPHY HEAD AND NECK TECHNIQUE: Multidetector CT imaging of the head and neck was performed using the standard protocol during bolus administration of intravenous contrast. Multiplanar CT image reconstructions and MIPs were obtained to evaluate the vascular anatomy. Carotid stenosis measurements (when applicable) are obtained utilizing NASCET criteria, using the distal internal carotid diameter as the denominator. RADIATION DOSE REDUCTION: This exam was performed according to the departmental dose-optimization program which includes automated exposure control, adjustment of the mA and/or kV according to patient size and/or use of iterative reconstruction technique. CONTRAST:  67mL OMNIPAQUE IOHEXOL 350 MG/ML SOLN COMPARISON:  None Available. FINDINGS: CT HEAD FINDINGS Brain: There is no evidence of an acute infarct, intracranial hemorrhage, mass, midline shift, or extra-axial fluid collection. The ventricles and sulci are normal. Vascular: Calcified atherosclerosis at the skull base. Skull: No fracture or suspicious osseous lesion. Sinuses: Minimal mucosal thickening in the left maxillary sinus. Clear mastoid air cells. Orbits: Unremarkable. Review of the MIP images confirms the above findings CTA NECK FINDINGS Aortic arch: Standard 3 vessel aortic arch with mild atherosclerotic plaque. No significant arch vessel origin stenosis. Right carotid system: Patent with prominent calcified and soft  plaque at the carotid bifurcation resulting in approximately 75% stenosis of the distal common carotid artery. No significant ICA stenosis. Left carotid system: Patent with a moderate amount of calcified plaque at the carotid bifurcation. No evidence of a significant stenosis. Vertebral arteries: Patent with the right being minimally larger than the left. Calcified plaque at both vertebral origins without significant stenosis. Skeleton: Advanced disc degeneration at C4-5 and C6-7. Other neck: No evidence of cervical lymphadenopathy or mass. Upper chest: Clear lung apices. Review of the MIP images confirms the above findings CTA HEAD FINDINGS Anterior circulation: The internal carotid arteries are patent from skull base to carotid termini with a moderate amount of calcified plaque bilaterally not resulting in significant stenosis. ACAs and MCAs are patent without evidence of a proximal branch occlusion or significant proximal stenosis. No aneurysm is identified. Posterior circulation: The intracranial vertebral arteries are patent to the basilar with a small amount of nonstenotic calcified plaque involving the proximal left V4 segment. Patent right PICA, bilateral AICA, and bilateral SCA origins are visualized. A left PICA origin is not clearly identified. The basilar artery is widely patent. There are small posterior communicating arteries bilaterally. Both PCAs are patent without evidence of a significant proximal stenosis. No aneurysm is identified. Venous sinuses: Patent. Anatomic variants: None. Review of the MIP images confirms the above findings IMPRESSION: 1. No evidence of acute intracranial abnormality. 2. 75% stenosis of the distal right common carotid artery. 3. Widely patent posterior circulation. 4. Aortic Atherosclerosis (ICD10-I70.0). Electronically Signed   By: Logan Bores M.D.   On: 05/29/2022 12:03   CT Angio Neck W and/or Wo Contrast  Result Date: 05/29/2022 CLINICAL DATA:  Carotid artery  stenosis.  Dizziness. EXAM: CT ANGIOGRAPHY HEAD AND NECK TECHNIQUE: Multidetector CT imaging of the head and neck was performed using the standard protocol during bolus administration of intravenous contrast. Multiplanar CT image reconstructions and MIPs were obtained to evaluate the vascular anatomy. Carotid stenosis measurements (when applicable) are obtained utilizing NASCET criteria, using the distal internal carotid diameter as the denominator. RADIATION DOSE REDUCTION: This exam was performed according to the departmental dose-optimization program which includes automated exposure control, adjustment of the mA and/or kV according to patient size and/or use of iterative reconstruction technique. CONTRAST:  26mL OMNIPAQUE IOHEXOL 350 MG/ML SOLN COMPARISON:  None Available. FINDINGS: CT HEAD FINDINGS Brain: There is no evidence of an acute infarct, intracranial hemorrhage, mass, midline shift, or extra-axial fluid collection. The ventricles and sulci are normal. Vascular: Calcified atherosclerosis at the skull base. Skull: No fracture or suspicious osseous lesion. Sinuses: Minimal mucosal thickening in the left maxillary sinus. Clear mastoid air cells. Orbits: Unremarkable. Review of the MIP images confirms the above findings CTA NECK FINDINGS Aortic arch: Standard 3 vessel aortic arch with mild atherosclerotic plaque. No significant arch vessel origin stenosis. Right carotid system: Patent with prominent calcified and soft plaque at the carotid bifurcation resulting in approximately 75% stenosis of the distal common carotid artery. No significant ICA stenosis. Left carotid system: Patent with a moderate amount of calcified plaque at the carotid bifurcation. No evidence of a significant stenosis. Vertebral arteries: Patent with the right being minimally larger than the left. Calcified plaque at both vertebral origins without significant stenosis. Skeleton: Advanced disc degeneration at C4-5 and C6-7. Other neck:  No evidence of cervical lymphadenopathy or mass. Upper chest: Clear lung apices. Review of the MIP images confirms the above findings CTA HEAD FINDINGS Anterior circulation: The internal carotid arteries are patent from skull base to carotid termini with a moderate amount of calcified plaque bilaterally not resulting in significant stenosis. ACAs and MCAs are patent without evidence of a proximal branch occlusion or significant proximal stenosis. No aneurysm is identified. Posterior circulation: The intracranial vertebral arteries are patent to the basilar with a small amount of nonstenotic calcified plaque involving the proximal left V4 segment. Patent right PICA, bilateral AICA, and bilateral SCA origins are visualized. A left PICA origin is not clearly identified. The basilar artery is widely patent. There are small posterior communicating arteries bilaterally. Both PCAs are patent without evidence of a significant proximal stenosis. No aneurysm is identified. Venous sinuses: Patent. Anatomic variants: None. Review of the MIP images confirms the above findings IMPRESSION: 1. No evidence of acute intracranial abnormality. 2. 75% stenosis of the distal right common carotid artery. 3. Widely patent posterior circulation. 4. Aortic Atherosclerosis (ICD10-I70.0). Electronically Signed   By: Logan Bores M.D.   On: 05/29/2022 12:03   DG Chest 1 View  Result Date: 05/29/2022 CLINICAL DATA:  Angina. EXAM: CHEST  1 VIEW COMPARISON:  11/17/2011 FINDINGS: 1032 hours. Low volume film. The cardiopericardial silhouette is within normal limits for size. The lungs are clear without focal pneumonia, edema, pneumothorax or pleural effusion. Telemetry leads overlie the chest. IMPRESSION: No active disease. Electronically Signed   By: Misty Stanley M.D.   On: 05/29/2022 10:48    Procedures Procedures  This patient was on telemetry or cardiac monitoring during their time in the ED.    Medications Ordered in ED Medications   sodium chloride (PF) 0.9 % injection (has no administration in time range)  sodium chloride 0.9 % bolus 1,000 mL (1,000 mLs Intravenous New Bag/Given 05/29/22 1022)  iohexol (OMNIPAQUE) 350 MG/ML injection 75 mL (75 mLs Intravenous Contrast Given 05/29/22 1114)    ED Course/ Medical Decision Making/ A&P                            Medical Decision Making Amount and/or Complexity of Data Reviewed Labs: ordered. Radiology: ordered.  Risk Prescription drug management.    MDM  This is a 71 y.o. male who presents to the ED with lightheadedness and generalized weakness The differential of this patient includes but is not limited to dehydration,  carotid artery stenosis, ACS, Anemia, electrolyte abnormality, arrhythmia, etc.   My Impression, Plan, and ED Course: Well appearing. No acute distress. Vitals HDS. BP here 162/87 which is higher for this patient.  Doubt this is posterior circulating stroke or other vertiginous presentation as it does not seem consistent with history. He does have a cardiac history and with similar symptoms to prior event, will order cardiac workup. Also has history of carotid artery stenosis with most recent image on 06/16/2021 and revealed 40-59% stenosis on left and 60-79% stenosis on right. He has always been asymptomatic from this so no intervention was indicated.   I personally ordered, reviewed, and interpreted all laboratory work and imaging and agree with radiologist interpretation. Results interpreted below:  CBC with stable thrombocytopenia. CMP with Cl 113 which is down from baseline. Glucose 113. BUN 24 with normal creatinine.  Troponin 4 UA with no infectious signs or abnormalities CXR without acute abnormality CTA head and neck  - No evidence of acute intracranial abnormality - 75 % stenosis of distal right common carotid artery - patent posterior circulation  Stenosis is not any different than it was last year. Doubt symptoms are related to carotid  artery stenosis. Patient with no vertiginous symptoms so do not feel we need to pursue MRI imaging. Patient feels better after IVF so there may be a orthostatic or dehydration element to this. With normal EKG and negative trops, doubt ACS or cardiac cause to symptoms. Mild bradycardia has been evident numerous times in past so doubt this is symptomatic bradycardia. Patient has remained HDS here. He has ambulated without difficulty or worsening symptoms. Do not think he requires any further workup in the ED. Plan to have him f/u with PCP. Return precautions are provided.    Charting Requirements Additional history is obtained from:  Independent historian External Records from outside source obtained and reviewed including: Reviewed prior carotid ultrasound, prior cardiology results.  Social Determinants of Health:  none Pertinant PMH that complicates patient's illness: hx of CABG, CAD, and carotid artery stenosis  Patient Care Problems that were addressed during this visit: - Lightheadedness: Acute illness with systemic symptoms This patient was maintained on a cardiac monitor/telemetry. I personally viewed and interpreted the cardiac monitor which reveals an underlying rhythm of SB/NSR Medications given in ED: IVF Reevaluation of the patient after these medicines showed that the patient improved I have reviewed home medications and made changes accordingly. Disposition: discharge with PCP f/u  This is a supervised visit with my attending physician, Dr. Sherry Ruffing. We have discussed this patient and they have altered the plan as needed.  Portions of this note were generated with [employer]. Dictation errors may occur despite best attempts at proofreading.      Final Clinical Impression(s) / ED Diagnoses Final diagnoses:  Lightheadedness    Rx / DC Orders ED Discharge Orders     None         Adolphus Birchwood, PA-C 05/29/22 1228    Tegeler, Gwenyth Allegra,  MD 05/29/22 [phone]

## 2022-06-02 ENCOUNTER — Encounter: Payer: Self-pay | Admitting: Cardiology

## 2022-06-02 ENCOUNTER — Ambulatory Visit: Payer: Medicare Other | Admitting: Cardiology

## 2022-06-02 VITALS — BP 144/84 | HR 74 | Ht 67.0 in | Wt 176.4 lb

## 2022-06-02 DIAGNOSIS — E782 Mixed hyperlipidemia: Secondary | ICD-10-CM

## 2022-06-02 DIAGNOSIS — I779 Disorder of arteries and arterioles, unspecified: Secondary | ICD-10-CM

## 2022-06-02 DIAGNOSIS — Z01812 Encounter for preprocedural laboratory examination: Secondary | ICD-10-CM

## 2022-06-02 DIAGNOSIS — I25118 Atherosclerotic heart disease of native coronary artery with other forms of angina pectoris: Secondary | ICD-10-CM | POA: Diagnosis not present

## 2022-06-02 DIAGNOSIS — I209 Angina pectoris, unspecified: Secondary | ICD-10-CM

## 2022-06-02 DIAGNOSIS — Z951 Presence of aortocoronary bypass graft: Secondary | ICD-10-CM

## 2022-06-02 LAB — CBC WITH DIFFERENTIAL/PLATELET
Basophils Absolute: 0 10*3/uL (ref 0.0–0.2)
Basos: 0 %
EOS (ABSOLUTE): 0.3 10*3/uL (ref 0.0–0.4)
Eos: 4 %
Hematocrit: 45.5 % (ref 37.5–51.0)
Hemoglobin: 15.5 g/dL (ref 13.0–17.7)
Lymphocytes Absolute: 1.6 10*3/uL (ref 0.7–3.1)
Lymphs: 22 %
MCH: 31.7 pg (ref 26.6–33.0)
MCHC: 34.1 g/dL (ref 31.5–35.7)
MCV: 93 fL (ref 79–97)
Monocytes Absolute: 0.9 10*3/uL (ref 0.1–0.9)
Monocytes: 12 %
Neutrophils Absolute: 4.5 10*3/uL (ref 1.4–7.0)
Neutrophils: 62 %
Platelets: 141 10*3/uL — ABNORMAL LOW (ref 150–450)
RBC: 4.89 x10E6/uL (ref 4.14–5.80)
RDW: 13.5 % (ref 11.6–15.4)
WBC: 7.2 10*3/uL (ref 3.4–10.8)

## 2022-06-02 LAB — BASIC METABOLIC PANEL
BUN/Creatinine Ratio: 18 (ref 10–24)
BUN: 21 mg/dL (ref 8–27)
CO2: 27 mmol/L (ref 20–29)
Calcium: 9.7 mg/dL (ref 8.6–10.2)
Chloride: 104 mmol/L (ref 96–106)
Creatinine, Ser: 1.17 mg/dL (ref 0.76–1.27)
Glucose: 104 mg/dL — ABNORMAL HIGH (ref 70–99)
Potassium: 4.3 mmol/L (ref 3.5–5.2)
Sodium: 137 mmol/L (ref 134–144)
eGFR: 67 mL/min/{1.73_m2} (ref >59)

## 2022-06-02 NOTE — H&P (View-Only) (Signed)
Cardiology Office Note:    Date:  06/02/2022   ID:  Scott Marshall, DOB 05/03/1951, MRN [id number]  PCP:  Greene, Jeffrey R, MD   [facility] Providers Cardiologist:  Katarina Nelson, MD {     Referring MD: Greene, Jeffrey R, MD    History of Present Illness:    Scott Marshall is a 70 y.o. male with a hx of CAD s/p CABG in 2012, HLD, and carotid artery disease who was previously followed by Dr. Nelson who now presents to clinic for hospital follow-up.  Saw Dr. Nelson on 06/2020 where he was doing very well. Active without anginal symptoms.  Carotid ultrasound that shows stable 60 to 79% stenosis on the right carotid artery and 40 to 59% stenosis on the left carotid artery without symptoms.  Was last seen in clinic on 06/2021 where he was doing very well from a CV standpoint. Remained very active without anginal symptoms. CTA neck with 75% stenosis of distal right common carotid which was overall stable. Symptoms were thought to be due to possible dehydration/orthostasis and he was discharged home.  Today, the patient states that he had another episode of near syncope last night. Specifically, he was sitting at his desk and began to feel very lightheaded. He got up and called his wife who called EMS. By the time of their arrival, his symptoms resolved. ECG with NSR and BP slightly elevated. He declined going to the hospital at that time.  Overall, symptoms are very similar to those that he had prior to needing his bypass surgery. He did not have chest pain at that time, but rather felt like he was going to pass out. He also had fatigue and decreased exercise capacity at that time. Currently, he is not having exertional symptoms and he has remained active. He is concerned, however, given that the last time he felt like this, he had significant coronary disease.   No recent illnesses. No new medications. Has remained hydrated without skipping meals. Resting HR in 50s normally.  The patient  denies chest pain, shortness of breath, nocturnal dyspnea, orthopnea or peripheral edema. There have been no palpitations. Complains of vertigo and lightheadedness.    Past Medical History:  Diagnosis Date   CAD (coronary artery disease)    October, 2012,, catheterization done, CABG   Carotid artery disease (HCC)    Doppler, October, 2012, 60-79% R. ICA   Dyslipidemia    Ejection fraction     EF 65%, catheterization, October, 2012   Fatigue    Hx of CABG    October, 2012, Dr. Hendrickson.    Shortness of breath    October, 2012, This was an anginal equivalent    Past Surgical History:  Procedure Laterality Date   CORONARY ARTERY BYPASS GRAFT  09/2011   TONSILLECTOMY      Current Medications: Current Meds  Medication Sig   aspirin 81 MG tablet Take 81 mg by mouth daily.   atorvastatin (LIPITOR) 80 MG tablet Take 1 tablet (80 mg total) by mouth daily. TAKE 1 TABLET BY MOUTH EVERY DAY.   ezetimibe (ZETIA) 10 MG tablet Take 1 tablet (10 mg total) by mouth daily.     Allergies:   Patient has no known allergies.   Social History   Socioeconomic History   Marital status: Married    Spouse name: Not on file   Number of children: 0   Years of education: Not on file   Highest education level: Not on   file  Occupational History   Occupation: management @ [employer]  Tobacco Use   Smoking status: Former    Types: Cigarettes    Quit date: 12/20/1984    Years since quitting: 37.4   Smokeless tobacco: Never  Substance and Sexual Activity   Alcohol use: Yes    Comment: 2-3 a year maybe   Drug use: No   Sexual activity: Not on file  Other Topics Concern   Not on file  Social History Narrative   Not on file   Social Determinants of Health   Financial Resource Strain: Not on file  Food Insecurity: Not on file  Transportation Needs: Not on file  Physical Activity: Not on file  Stress: Not on file  Social Connections: Not on file     Family History: The patient's  family history includes Coronary artery disease in his unknown relative; Dementia in his mother; Heart attack (age of onset: 61) in his father; Hyperlipidemia in his brother; Hypertension in his unknown relative; Stroke in his mother; Thyroid disease in his mother.  ROS:   Please see the history of present illness. (+) Vertigo (+) Lightheadedness  (+) Near Syncope (+) Nauseous (+) Chills All other systems are reviewed and negative.     EKGs/Labs/Other Studies Reviewed:    The following studies were reviewed today:  Carotid Ultrasound 06/16/21: Summary:  Right Carotid: Velocities in the right ICA are consistent with a 60-79% stenosis. Non-hemodynamically significant plaque <50% noted  \In the CCA. Stable RICA velocities.   Left Carotid: Velocities in the left ICA are consistent with a 40-59%  stenosis. Non-hemodynamically significant plaque <50% noted in the  CCA. Stable LICA velocities; stenosis based on PSV and plaque formation, low end range.   Vertebrals:  Bilateral vertebral arteries demonstrate antegrade flow.  Subclavians: Normal flow hemodynamics were seen in bilateral subclavian arteries.   EKG:   06/02/2022 EKG: EKG was not ordered.   06/30/2021: Sinus bradycardia, HR 54 bpm, RBBB  Recent Labs: 06/30/2021: TSH 12.600 05/29/2022: ALT 27; BUN 24; Creatinine, Ser 1.10; Hemoglobin 15.3; Platelets 139; Potassium 4.2; Sodium 142  Recent Lipid Panel    Component Value Date/Time   CHOL 142 08/10/2021 1037   TRIG 82 08/10/2021 1037   HDL 39 (L) 08/10/2021 1037   CHOLHDL 3.6 08/10/2021 1037   CHOLHDL 4.9 12/10/2015 0929   VLDL 11 12/10/2015 0929   LDLCALC 87 08/10/2021 1037         Physical Exam:    VS:  BP (!) 144/84   Pulse 74   Ht 5' 7" (1.702 m)   Wt 176 lb 6.4 oz (80 kg)   SpO2 95%   BMI 27.63 kg/m     Wt Readings from Last 3 Encounters:  06/02/22 176 lb 6.4 oz (80 kg)  05/29/22 177 lb (80.3 kg)  06/30/21 179 lb 3.2 oz (81.3 kg)     GEN: Well  nourished, well developed in no acute distress HEENT: Normal NECK: No JVD; +right carotid bruit CARDIAC: Regular rhythm, bradycardic, no murmurs, rubs, gallops RESPIRATORY:  Clear to auscultation without rales, wheezing or rhonchi  ABDOMEN: Soft, non-tender, non-distended MUSCULOSKELETAL:  No edema; No deformity  SKIN: Warm and dry NEUROLOGIC:  Alert and oriented x 3 PSYCHIATRIC:  Normal affect   ASSESSMENT:    1. Coronary artery disease involving native coronary artery of native heart with other form of angina pectoris (HCC)   2. Pre-procedure lab exam   3. Mixed hyperlipidemia   4. Hx of   CABG   5. Angina pectoris (HCC)   6. Bilateral carotid artery disease, unspecified type (HCC)    PLAN:    In order of problems listed above:  #Dizziness: #CAD s/p CABG in 2012: Patient with two episodes of near syncope similar to how he felt prior to his bypass surgery. Work-up in the ER reassuring with ECG nonischemic and trop negative. Has 75% RICA stenosis but this is stable and unlikely to cause dizziness. Given that his anginal equivalent was previously lightheadedness with normal stress test at that time, known CAD, and recurrence of symptoms despite normal BP and no changes in meds, will plan for cath for further evaluation. -Plan for coronary angiography -Check TTE -If cath unrevealing, will plan for zio monitor -Continue ASA 81mg daily -Continue lipitor 80mg daily -ER precautions provided  #Carotid artery stenosis: Stable 40-59% on left and 75% on right. Stable on CT in 05/2022. Less likely to be causing dizziness and no history of stroke. Scheduled to see vascular later this month. -Continue ASA 81mg daily -Continue lipitor 80mg daily -Has follow-up with vascular  #White coat HTN: Controlled at home mainly running 110/60s.   #HLD: -Continue lipitor 80mg daily -Continue zetia 10mg daily   Medication Adjustments/Labs and Tests Ordered: Current medicines are reviewed at  length with the patient today.  Concerns regarding medicines are outlined above.  Orders Placed This Encounter  Procedures   Basic metabolic panel   CBC w/Diff   ECHOCARDIOGRAM COMPLETE   No orders of the defined types were placed in this encounter.   Patient Instructions  Medication Instructions:   Your physician recommends that you continue on your current medications as directed. Please refer to the Current Medication list given to you today.  *If you need a refill on your cardiac medications before your next appointment, please call your pharmacy*   Lab Work:  TODAY--PRE-CATH LABS--BMET AND CBC W DIFF  If you have labs (blood work) drawn today and your tests are completely normal, you will receive your results only by: MyChart Message (if you have MyChart) OR A paper copy in the mail If you have any lab test that is abnormal or we need to change your treatment, we will call you to review the results.   Testing/Procedures:  Your physician has requested that you have an echocardiogram. Echocardiography is a painless test that uses sound waves to create images of your heart. It provides your doctor with information about the size and shape of your heart and how well your heart's chambers and valves are working. This procedure takes approximately one hour. There are no restrictions for this procedure.    [facility] [address] [facility] [address], [address] [place] [address] Dept: [phone] Loc: [phone]  Scott Marshall  06/02/2022  You are scheduled for a Cardiac Catheterization on Friday, June 16 with Dr. Jayadeep Varanasi.  1. Please arrive at the Main Entrance A at [facility]: [address] [place],  [address] at 8:30 AM (This time is two hours before your procedure to ensure your preparation). Free valet parking service is available.   Special note: Every effort is made to  have your procedure done on time. Please understand that emergencies sometimes delay scheduled procedures.  2. Diet: Do not eat solid foods after midnight.  You may have clear liquids until 5 AM upon the day of the procedure.  3. Labs: You will need to have blood drawn   on Wednesday, June 14 at [facility] [address], [place]  Open: 7:30am - 5pm    Phone: [phone]. You do not need to be fasting.  4. Medication instructions in preparation for your procedure:   Contrast Allergy: No   On the morning of your procedure, take Aspirin and any morning medicines NOT listed above.  You may use sips of water.  5. Plan to go home the same day, you will only stay overnight if medically necessary. 6. You MUST have a responsible adult to drive you home. 7. An adult MUST be with you the first 24 hours after you arrive home. 8. Bring a current list of your medications, and the last time and date medication taken. 9. Bring ID and current insurance cards. 10.Please wear clothes that are easy to get on and off and wear slip-on shoes.  Thank you for allowing us to care for you!   -- [place] [facility]     Follow-Up:  AS PLANNED WITH DR. Neesha Langton WITH LAB SAME DAY IN JULY   Important Information About Sugar         I,Tinashe Williams,acting as a scribe for Elmina Hendel E Sherilyn Windhorst, MD.,have documented all relevant documentation on the behalf of Scott Degante E Camaryn Lumbert, MD,as directed by  Brittaney Beaulieu E Shanita Kanan, MD while in the presence of Collis Thede E Hiilani Jetter, MD.   I, Leeya Rusconi E Ticia Virgo, MD, have reviewed all documentation for this visit. The documentation on 06/02/22 for the exam, diagnosis, procedures, and orders are all accurate and complete.  

## 2022-06-02 NOTE — Progress Notes (Signed)
Cardiology Office Note:    Date:  06/02/2022   ID:  Gwynneth Albright, DOB [birth year], MRN [id number]  PCP:  Wendie Agreste, MD   [facility] Providers Cardiologist:  Ena Dawley, MD {     Referring MD: Wendie Agreste, MD    History of Present Illness:    Scott Marshall is a 71 y.o. male with a hx of CAD s/p CABG in 2012, HLD, and carotid artery disease who was previously followed by Dr. Meda Coffee who now presents to clinic for hospital follow-up.  Saw Dr. Meda Coffee on 06/2020 where he was doing very well. Active without anginal symptoms.  Carotid ultrasound that shows stable 60 to 79% stenosis on the right carotid artery and 40 to 59% stenosis on the left carotid artery without symptoms.  Was last seen in clinic on 06/2021 where he was doing very well from a CV standpoint. Remained very active without anginal symptoms. CTA neck with 75% stenosis of distal right common carotid which was overall stable. Symptoms were thought to be due to possible dehydration/orthostasis and he was discharged home.  Today, the patient states that he had another episode of near syncope last night. Specifically, he was sitting at his desk and began to feel very lightheaded. He got up and called his wife who called EMS. By the time of their arrival, his symptoms resolved. ECG with NSR and BP slightly elevated. He declined going to the hospital at that time.  Overall, symptoms are very similar to those that he had prior to needing his bypass surgery. He did not have chest pain at that time, but rather felt like he was going to pass out. He also had fatigue and decreased exercise capacity at that time. Currently, he is not having exertional symptoms and he has remained active. He is concerned, however, given that the last time he felt like this, he had significant coronary disease.   No recent illnesses. No new medications. Has remained hydrated without skipping meals. Resting HR in 50s normally.  The patient  denies chest pain, shortness of breath, nocturnal dyspnea, orthopnea or peripheral edema. There have been no palpitations. Complains of vertigo and lightheadedness.    Past Medical History:  Diagnosis Date   CAD (coronary artery disease)    October, 2012,, catheterization done, CABG   Carotid artery disease ([place])    Doppler, October, 2012, 60-79% R. ICA   Dyslipidemia    Ejection fraction     EF 65%, catheterization, October, 2012   Fatigue    Hx of CABG    October, 2012, Dr. Roxan Hockey.    Shortness of breath    October, 2012, This was an anginal equivalent    Past Surgical History:  Procedure Laterality Date   CORONARY ARTERY BYPASS GRAFT  09/2011   TONSILLECTOMY      Current Medications: Current Meds  Medication Sig   aspirin 81 MG tablet Take 81 mg by mouth daily.   atorvastatin (LIPITOR) 80 MG tablet Take 1 tablet (80 mg total) by mouth daily. TAKE 1 TABLET BY MOUTH EVERY DAY.   ezetimibe (ZETIA) 10 MG tablet Take 1 tablet (10 mg total) by mouth daily.     Allergies:   Patient has no known allergies.   Social History   Socioeconomic History   Marital status: Married    Spouse name: Not on file   Number of children: 0   Years of education: Not on file   Highest education level: Not on  file  Occupational History   Occupation: management @ [employer]  Tobacco Use   Smoking status: Former    Types: Cigarettes    Quit date: 12/20/1984    Years since quitting: 37.4   Smokeless tobacco: Never  Substance and Sexual Activity   Alcohol use: Yes    Comment: 2-3 a year maybe   Drug use: No   Sexual activity: Not on file  Other Topics Concern   Not on file  Social History Narrative   Not on file   Social Determinants of Health   Financial Resource Strain: Not on file  Food Insecurity: Not on file  Transportation Needs: Not on file  Physical Activity: Not on file  Stress: Not on file  Social Connections: Not on file     Family History: The patient's  family history includes Coronary artery disease in his unknown relative; Dementia in his mother; Heart attack (age of onset: 24) in his father; Hyperlipidemia in his brother; Hypertension in his unknown relative; Stroke in his mother; Thyroid disease in his mother.  ROS:   Please see the history of present illness. (+) Vertigo (+) Lightheadedness  (+) Near Syncope (+) Nauseous (+) Chills All other systems are reviewed and negative.     EKGs/Labs/Other Studies Reviewed:    The following studies were reviewed today:  Carotid Ultrasound 06/16/21: Summary:  Right Carotid: Velocities in the right ICA are consistent with a 60-79% stenosis. Non-hemodynamically significant plaque <50% noted  \In the CCA. Stable RICA velocities.   Left Carotid: Velocities in the left ICA are consistent with a 40-59%  stenosis. Non-hemodynamically significant plaque <50% noted in the  CCA. Stable LICA velocities; stenosis based on PSV and plaque formation, low end range.   Vertebrals:  Bilateral vertebral arteries demonstrate antegrade flow.  Subclavians: Normal flow hemodynamics were seen in bilateral subclavian arteries.   EKG:   06/02/2022 EKG: EKG was not ordered.   06/30/2021: Sinus bradycardia, HR 54 bpm, RBBB  Recent Labs: 06/30/2021: TSH 12.600 05/29/2022: ALT 27; BUN 24; Creatinine, Ser 1.10; Hemoglobin 15.3; Platelets 139; Potassium 4.2; Sodium 142  Recent Lipid Panel    Component Value Date/Time   CHOL 142 08/10/2021 1037   TRIG 82 08/10/2021 1037   HDL 39 (L) 08/10/2021 1037   CHOLHDL 3.6 08/10/2021 1037   CHOLHDL 4.9 12/10/2015 0929   VLDL 11 12/10/2015 0929   LDLCALC 87 08/10/2021 1037         Physical Exam:    VS:  BP (!) 144/84   Pulse 74   Ht 5\' 7"  (1.702 m)   Wt 176 lb 6.4 oz (80 kg)   SpO2 95%   BMI 27.63 kg/m     Wt Readings from Last 3 Encounters:  06/02/22 176 lb 6.4 oz (80 kg)  05/29/22 177 lb (80.3 kg)  06/30/21 179 lb 3.2 oz (81.3 kg)     GEN: Well  nourished, well developed in no acute distress HEENT: Normal NECK: No JVD; +right carotid bruit CARDIAC: Regular rhythm, bradycardic, no murmurs, rubs, gallops RESPIRATORY:  Clear to auscultation without rales, wheezing or rhonchi  ABDOMEN: Soft, non-tender, non-distended MUSCULOSKELETAL:  No edema; No deformity  SKIN: Warm and dry NEUROLOGIC:  Alert and oriented x 3 PSYCHIATRIC:  Normal affect   ASSESSMENT:    1. Coronary artery disease involving native coronary artery of native heart with other form of angina pectoris ([place])   2. Pre-procedure lab exam   3. Mixed hyperlipidemia   4. Hx of  CABG   5. Angina pectoris ([place])   6. Bilateral carotid artery disease, unspecified type ([place])    PLAN:    In order of problems listed above:  #Dizziness: #CAD s/p CABG in 2012: Patient with two episodes of near syncope similar to how he felt prior to his bypass surgery. Work-up in the ER reassuring with ECG nonischemic and trop negative. Has 75% RICA stenosis but this is stable and unlikely to cause dizziness. Given that his anginal equivalent was previously lightheadedness with normal stress test at that time, known CAD, and recurrence of symptoms despite normal BP and no changes in meds, will plan for cath for further evaluation. -Plan for coronary angiography -Check TTE -If cath unrevealing, will plan for zio monitor -Continue ASA 81mg  daily -Continue lipitor 80mg  daily -ER precautions provided  #Carotid artery stenosis: Stable 40-59% on left and 75% on right. Stable on CT in 05/2022. Less likely to be causing dizziness and no history of stroke. Scheduled to see vascular later this month. -Continue ASA 81mg  daily -Continue lipitor 80mg  daily -Has follow-up with vascular  #White coat HTN: Controlled at home mainly running 110/60s.   #HLD: -Continue lipitor 80mg  daily -Continue zetia 10mg  daily   Medication Adjustments/Labs and Tests Ordered: Current medicines are reviewed at  length with the patient today.  Concerns regarding medicines are outlined above.  Orders Placed This Encounter  Procedures   Basic metabolic panel   CBC w/Diff   ECHOCARDIOGRAM COMPLETE   No orders of the defined types were placed in this encounter.   Patient Instructions  Medication Instructions:   Your physician recommends that you continue on your current medications as directed. Please refer to the Current Medication list given to you today.  *If you need a refill on your cardiac medications before your next appointment, please call your pharmacy*   Lab Work:  [facility] LABS--BMET AND CBC W DIFF  If you have labs (blood work) drawn today and your tests are completely normal, you will receive your results only by: [place] (if you have MyChart) OR A paper copy in the mail If you have any lab test that is abnormal or we need to change your treatment, we will call you to review the results.   Testing/Procedures:  Your physician has requested that you have an echocardiogram. Echocardiography is a painless test that uses sound waves to create images of your heart. It provides your doctor with information about the size and shape of your heart and how well your heart's chambers and valves are working. This procedure takes approximately one hour. There are no restrictions for this procedure.    [place] OFFICE [place], [place] [address] Dept: [phone] Loc: [place]  06/02/2022  You are scheduled for a Cardiac Catheterization on Friday, June 16 with Dr. Larae Grooms.  1. Please arrive at the Main Entrance A at [facility]: [place], [place] [address] at 8:30 AM (This time is two hours before your procedure to ensure your preparation). Free valet parking service is available.   Special note: Every effort is made to  have your procedure done on time. Please understand that emergencies sometimes delay scheduled procedures.  2. Diet: Do not eat solid foods after midnight.  You may have clear liquids until 5 AM upon the day of the procedure.  3. Labs: You will need to have blood drawn  on Wednesday, June 14 at [facility]. [address]  Open: 7:30am - 5pm    Phone: [phone]. You do not need to be fasting.  4. Medication instructions in preparation for your procedure:   Contrast Allergy: No   On the morning of your procedure, take Aspirin and any morning medicines NOT listed above.  You may use sips of water.  5. Plan to go home the same day, you will only stay overnight if medically necessary. 6. You MUST have a responsible adult to drive you home. 7. An adult MUST be with you the first 24 hours after you arrive home. 8. Bring a current list of your medications, and the last time and date medication taken. 9. Bring ID and current insurance cards. 10.Please wear clothes that are easy to get on and off and wear slip-on shoes.  Thank you for allowing [place] to care for you!   -- [place] [facility]     Follow-Up:  AS PLANNED WITH DR. Johney Frame WITH LAB SAME DAY IN JULY   Important Information About Sugar         I,Tinashe Trigg as a scribe for Freada Bergeron, MD.,have documented all relevant documentation on the behalf of Freada Bergeron, MD,as directed by  Freada Bergeron, MD while in the presence of Freada Bergeron, MD.   I, Freada Bergeron, MD, have reviewed all documentation for this visit. The documentation on 06/02/22 for the exam, diagnosis, procedures, and orders are all accurate and complete.

## 2022-06-02 NOTE — Patient Instructions (Signed)
Medication Instructions:   Your physician recommends that you continue on your current medications as directed. Please refer to the Current Medication list given to you today.  *If you need a refill on your cardiac medications before your next appointment, please call your pharmacy*   Lab Work:  [facility] LABS--BMET AND CBC W DIFF  If you have labs (blood work) drawn today and your tests are completely normal, you will receive your results only by: [place] (if you have MyChart) OR A paper copy in the mail If you have any lab test that is abnormal or we need to change your treatment, we will call you to review the results.   Testing/Procedures:  Your physician has requested that you have an echocardiogram. Echocardiography is a painless test that uses sound waves to create images of your heart. It provides your doctor with information about the size and shape of your heart and how well your heart's chambers and valves are working. This procedure takes approximately one hour. There are no restrictions for this procedure.    [facility] OFF[facility] [place], [place] [address] Dept: [phone] Loc: [place]  06/02/2022  You are scheduled for a Cardiac Catheterization on Friday, June 16 with Dr. Larae Grooms.  1. Please arrive at the Main Entrance A at [facility]: [place], [place] [address] at 8:30 AM (This time is two hours before your procedure to ensure your preparation). Free valet parking service is available.   Special note: Every effort is made to have your procedure done on time. Please understand that emergencies sometimes delay scheduled procedures.  2. Diet: Do not eat solid foods after midnight.  You may have clear liquids until 5 AM upon the day of the procedure.  3. Labs: You will need to have blood drawn on  Wednesday, June 14 at [facility] at [facility]. [address]  Open: 7:30am - 5pm    Phone: [phone]. You do not need to be fasting.  4. Medication instructions in preparation for your procedure:   Contrast Allergy: No   On the morning of your procedure, take Aspirin and any morning medicines NOT listed above.  You may use sips of water.  5. Plan to go home the same day, you will only stay overnight if medically necessary. 6. You MUST have a responsible adult to drive you home. 7. An adult MUST be with you the first 24 hours after you arrive home. 8. Bring a current list of your medications, and the last time and date medication taken. 9. Bring ID and current insurance cards. 10.Please wear clothes that are easy to get on and off and wear slip-on shoes.  Thank you for allowing [place] to care for you!   -- [facility]     Follow-Up:  AS PLANNED WITH DR. Johney Frame WITH LAB SAME DAY IN JULY   Important Information About [place]

## 2022-06-02 NOTE — Telephone Encounter (Signed)
Error

## 2022-06-03 ENCOUNTER — Telehealth: Payer: Self-pay | Admitting: *Deleted

## 2022-06-03 NOTE — Telephone Encounter (Signed)
Cardiac Catheterization scheduled at [facility] for: Friday June 04, 2022 10:30 AM Arrival time and place: [facility] Main Entrance A at: 8:30 AM   Nothing to eat after midnight prior to procedure, clear liquids until 5 AM day of procedure.   Medication instructions: -Usual morning medications can be taken with sips of water including aspirin 81 mg.  Confirmed patient has responsible adult to drive home post procedure and be with patient first 24 hours after arriving home.  Patient reports no new symptoms concerning for COVID-19.  Reviewed procedure instructions with patient.

## 2022-06-04 ENCOUNTER — Ambulatory Visit (HOSPITAL_COMMUNITY)
Admission: RE | Admit: 2022-06-04 | Discharge: 2022-06-04 | Disposition: A | Discharge status: Home / Self Care | Payer: Medicare Other | Attending: Interventional Cardiology | Admitting: Interventional Cardiology

## 2022-06-04 ENCOUNTER — Encounter (HOSPITAL_COMMUNITY)
Admission: RE | Disposition: A | Discharge status: Home / Self Care | Payer: Self-pay | Source: Home / Self Care | Attending: Interventional Cardiology

## 2022-06-04 ENCOUNTER — Other Ambulatory Visit: Payer: Self-pay

## 2022-06-04 DIAGNOSIS — Z7982 Long term (current) use of aspirin: Secondary | ICD-10-CM | POA: Diagnosis not present

## 2022-06-04 DIAGNOSIS — I25118 Atherosclerotic heart disease of native coronary artery with other forms of angina pectoris: Secondary | ICD-10-CM | POA: Diagnosis present

## 2022-06-04 DIAGNOSIS — Z79899 Other long term (current) drug therapy: Secondary | ICD-10-CM | POA: Insufficient documentation

## 2022-06-04 DIAGNOSIS — Z87891 Personal history of nicotine dependence: Secondary | ICD-10-CM | POA: Insufficient documentation

## 2022-06-04 DIAGNOSIS — Z951 Presence of aortocoronary bypass graft: Secondary | ICD-10-CM | POA: Insufficient documentation

## 2022-06-04 DIAGNOSIS — E782 Mixed hyperlipidemia: Secondary | ICD-10-CM | POA: Insufficient documentation

## 2022-06-04 DIAGNOSIS — I6523 Occlusion and stenosis of bilateral carotid arteries: Secondary | ICD-10-CM | POA: Insufficient documentation

## 2022-06-04 HISTORY — PX: LEFT HEART CATH AND CORS/GRAFTS ANGIOGRAPHY: CATH118250

## 2022-06-04 SURGERY — LEFT HEART CATH AND CORS/GRAFTS ANGIOGRAPHY
Anesthesia: LOCAL

## 2022-06-04 MED ORDER — SODIUM CHLORIDE 0.9% FLUSH: 3.0000 mL | INTRAVENOUS | Status: DC | PRN

## 2022-06-04 MED ORDER — LIDOCAINE HCL (PF) 1 % IJ SOLN
INTRAMUSCULAR | Status: DC | PRN
  Administered 2022-06-04: 20 mL via INTRADERMAL

## 2022-06-04 MED ORDER — MIDAZOLAM HCL 2 MG/2ML IJ SOLN
INTRAMUSCULAR | Status: DC | PRN
  Administered 2022-06-04: 2 mg via INTRAVENOUS

## 2022-06-04 MED ORDER — SODIUM CHLORIDE 0.9% FLUSH: 3.0000 mL | Freq: Two times a day (BID) | INTRAVENOUS | Status: DC

## 2022-06-04 MED ORDER — HEPARIN (PORCINE) IN NACL 1000-0.9 UT/500ML-% IV SOLN
INTRAVENOUS | Status: DC | PRN
  Administered 2022-06-04 (×2): 500 mL

## 2022-06-04 MED ORDER — SODIUM CHLORIDE 0.9 % IV SOLN: 250.0000 mL | INTRAVENOUS | Status: DC | PRN

## 2022-06-04 MED ORDER — SODIUM CHLORIDE 0.9 % WEIGHT BASED INFUSION
3.0000 mL/kg/h | INTRAVENOUS | Status: AC
  Administered 2022-06-04: 3 mL/kg/h via INTRAVENOUS

## 2022-06-04 MED ORDER — HEPARIN SODIUM (PORCINE) 1000 UNIT/ML IJ SOLN
INTRAMUSCULAR | Status: AC
  Filled 2022-06-04: qty 10

## 2022-06-04 MED ORDER — LABETALOL HCL 5 MG/ML IV SOLN: 10.0000 mg | INTRAVENOUS | Status: DC | PRN

## 2022-06-04 MED ORDER — VERAPAMIL HCL 2.5 MG/ML IV SOLN
INTRAVENOUS | Status: AC
  Filled 2022-06-04: qty 2

## 2022-06-04 MED ORDER — ACETAMINOPHEN 325 MG PO TABS: 650.0000 mg | ORAL_TABLET | ORAL | Status: DC | PRN

## 2022-06-04 MED ORDER — SODIUM CHLORIDE 0.9 % WEIGHT BASED INFUSION: 1.0000 mL/kg/h | INTRAVENOUS | Status: DC

## 2022-06-04 MED ORDER — IOHEXOL 350 MG/ML SOLN
INTRAVENOUS | Status: DC | PRN
  Administered 2022-06-04: 90 mL via INTRA_ARTERIAL

## 2022-06-04 MED ORDER — LIDOCAINE HCL (PF) 1 % IJ SOLN
INTRAMUSCULAR | Status: AC
Start: 2022-06-04 — End: ?
  Filled 2022-06-04: qty 30

## 2022-06-04 MED ORDER — FENTANYL CITRATE (PF) 100 MCG/2ML IJ SOLN
INTRAMUSCULAR | Status: AC
  Filled 2022-06-04: qty 2

## 2022-06-04 MED ORDER — FENTANYL CITRATE (PF) 100 MCG/2ML IJ SOLN
INTRAMUSCULAR | Status: DC | PRN
  Administered 2022-06-04: 25 ug via INTRAVENOUS

## 2022-06-04 MED ORDER — HYDRALAZINE HCL 20 MG/ML IJ SOLN: 10.0000 mg | INTRAMUSCULAR | Status: DC | PRN

## 2022-06-04 MED ORDER — ASPIRIN 81 MG PO CHEW: 81.0000 mg | CHEWABLE_TABLET | ORAL | Status: DC

## 2022-06-04 MED ORDER — SODIUM CHLORIDE 0.9 % IV SOLN: INTRAVENOUS | Status: AC

## 2022-06-04 MED ORDER — MIDAZOLAM HCL 2 MG/2ML IJ SOLN
INTRAMUSCULAR | Status: AC
  Filled 2022-06-04: qty 2

## 2022-06-04 MED ORDER — ONDANSETRON HCL 4 MG/2ML IJ SOLN: 4.0000 mg | Freq: Four times a day (QID) | INTRAMUSCULAR | Status: DC | PRN

## 2022-06-04 SURGICAL SUPPLY — 12 items
CATH EXPO 5F MPA-1 (CATHETERS) ×1 IMPLANT
CATH INFINITI 5FR AL1 (CATHETERS) ×1 IMPLANT
CATH INFINITI 5FR MULTPACK ANG (CATHETERS) ×1 IMPLANT
CLOSURE MYNX CONTROL 5F (Vascular Products) ×1 IMPLANT
KIT HEART LEFT (KITS) ×2 IMPLANT
PACK CARDIAC CATHETERIZATION (CUSTOM PROCEDURE TRAY) ×2 IMPLANT
SHEATH PINNACLE 5F 10CM (SHEATH) ×1 IMPLANT
SHEATH PROBE COVER 6X72 (BAG) ×1 IMPLANT
SYR MEDRAD MARK 7 150ML (SYRINGE) ×2 IMPLANT
TRANSDUCER W/STOPCOCK (MISCELLANEOUS) ×2 IMPLANT
TUBING CIL FLEX 10 FLL-RA (TUBING) ×2 IMPLANT
WIRE EMERALD 3MM-J .035X150CM (WIRE) ×1 IMPLANT

## 2022-06-04 NOTE — Interval H&P Note (Signed)
Cath Lab Visit (complete for each Cath Lab visit)  Clinical Evaluation Leading to the Procedure:   ACS: No.  Non-ACS:    Anginal Classification: CCS III  Anti-ischemic medical therapy: Minimal Therapy (1 class of medications)  Non-Invasive Test Results: No non-invasive testing performed  Prior CABG: Previous CABG      History and Physical Interval Note:  06/04/2022 11:29 AM  Scott Marshall  has presented today for surgery, with the diagnosis of angina - cad.  The various methods of treatment have been discussed with the patient and family. After consideration of risks, benefits and other options for treatment, the patient has consented to  Procedure(s): LEFT HEART CATH AND CORS/GRAFTS ANGIOGRAPHY (N/A) as a surgical intervention.  The patient's history has been reviewed, patient examined, no change in status, stable for surgery.  I have reviewed the patient's chart and labs.  Questions were answered to the patient's satisfaction.     Lance Muss

## 2022-06-06 ENCOUNTER — Encounter: Payer: Self-pay | Admitting: Cardiology

## 2022-06-06 DIAGNOSIS — I25118 Atherosclerotic heart disease of native coronary artery with other forms of angina pectoris: Secondary | ICD-10-CM

## 2022-06-06 DIAGNOSIS — Z951 Presence of aortocoronary bypass graft: Secondary | ICD-10-CM

## 2022-06-06 DIAGNOSIS — R42 Dizziness and giddiness: Secondary | ICD-10-CM

## 2022-06-07 ENCOUNTER — Encounter (HOSPITAL_COMMUNITY): Payer: Self-pay | Admitting: Interventional Cardiology

## 2022-06-07 ENCOUNTER — Ambulatory Visit (INDEPENDENT_AMBULATORY_CARE_PROVIDER_SITE_OTHER): Payer: Medicare Other

## 2022-06-07 DIAGNOSIS — Z951 Presence of aortocoronary bypass graft: Secondary | ICD-10-CM

## 2022-06-07 DIAGNOSIS — R42 Dizziness and giddiness: Secondary | ICD-10-CM

## 2022-06-07 DIAGNOSIS — I25118 Atherosclerotic heart disease of native coronary artery with other forms of angina pectoris: Secondary | ICD-10-CM

## 2022-06-07 NOTE — Telephone Encounter (Signed)
MOVE ECHO APPT UP TO SOONER DATE PER DR. Shari Prows Received: Today Scott Billow, LPN [id number] @ 4:03  pt is aware and wanted to know if Dr. Shari Prows has seen his MyChart message that he sent her last week?   Will route to Dr. Shari Prows to advise on mychart message, although it appears she already addressed this.

## 2022-06-07 NOTE — Progress Notes (Unsigned)
Enrolled for [employer] to mail a ZIO XT long term holter monitor to the patients address on file.  

## 2022-06-07 NOTE — Telephone Encounter (Signed)
RE: 14 DAY ZIO PER DR. Shari Prows Received: Today Anner Crete, Pleas Patricia, LPN Done        Previous Messages    ----- Message -----  From: Loa Socks, LPN  Sent: 12/22/129  11:36 AM EDT  To: Ernst Bowler; Katrina Claria Dice  Subject: 14 DAY ZIO PER DR. Shari Prows                   Dr. Shari Prows ordered for this pt to get a 14 day zio done for dizziness and recent cath was unchanged.   Order is in.  Can you please enroll the pt and let me know when you do?   Thanks  [employer]

## 2022-06-07 NOTE — Telephone Encounter (Signed)
-----   Message from Meriam Sprague, MD sent at 06/07/2022 11:22 AM EDT ----- Hello Friend!!  Can we get him a 14day zio and see if his echo can be moved up if there is a cancellation. Cath was stable!  Thank you!!  -Herbert Seta

## 2022-06-07 NOTE — Telephone Encounter (Signed)
Order for 14 day zio placed and staff message sent to our monitor tech to enroll the pt for this.    Will reach out to Echo Scheduler to have her move his echo appt up to a sooner date, per Dr. Shari Prows.  Pt made aware of plan.

## 2022-06-14 ENCOUNTER — Other Ambulatory Visit: Payer: Self-pay

## 2022-06-14 ENCOUNTER — Other Ambulatory Visit: Payer: Self-pay | Admitting: Cardiology

## 2022-06-14 DIAGNOSIS — I779 Disorder of arteries and arterioles, unspecified: Secondary | ICD-10-CM

## 2022-06-14 DIAGNOSIS — E7849 Other hyperlipidemia: Secondary | ICD-10-CM

## 2022-06-14 DIAGNOSIS — I6523 Occlusion and stenosis of bilateral carotid arteries: Secondary | ICD-10-CM

## 2022-06-14 DIAGNOSIS — I2510 Atherosclerotic heart disease of native coronary artery without angina pectoris: Secondary | ICD-10-CM

## 2022-06-14 MED ORDER — ATORVASTATIN CALCIUM 80 MG PO TABS: 80.0000 mg | ORAL_TABLET | Freq: Every day | ORAL | 3 refills | Status: DC

## 2022-06-15 ENCOUNTER — Ambulatory Visit (HOSPITAL_COMMUNITY)
Admission: RE | Admit: 2022-06-15 | Discharge: 2022-06-15 | Disposition: A | Discharge status: Home / Self Care | Payer: Medicare Other | Source: Ambulatory Visit | Attending: Cardiology | Admitting: Cardiology

## 2022-06-15 DIAGNOSIS — I6523 Occlusion and stenosis of bilateral carotid arteries: Secondary | ICD-10-CM | POA: Diagnosis not present

## 2022-06-15 DIAGNOSIS — I779 Disorder of arteries and arterioles, unspecified: Secondary | ICD-10-CM

## 2022-06-16 ENCOUNTER — Ambulatory Visit (HOSPITAL_COMMUNITY): Payer: Medicare Other | Attending: Cardiology

## 2022-06-16 ENCOUNTER — Telehealth: Payer: Self-pay | Admitting: *Deleted

## 2022-06-16 DIAGNOSIS — I25118 Atherosclerotic heart disease of native coronary artery with other forms of angina pectoris: Secondary | ICD-10-CM

## 2022-06-16 DIAGNOSIS — I779 Disorder of arteries and arterioles, unspecified: Secondary | ICD-10-CM

## 2022-06-16 DIAGNOSIS — I6523 Occlusion and stenosis of bilateral carotid arteries: Secondary | ICD-10-CM

## 2022-06-16 DIAGNOSIS — Z951 Presence of aortocoronary bypass graft: Secondary | ICD-10-CM | POA: Diagnosis present

## 2022-06-16 DIAGNOSIS — I209 Angina pectoris, unspecified: Secondary | ICD-10-CM

## 2022-06-16 LAB — ECHOCARDIOGRAM COMPLETE
Area-P 1/2: 3.68 cm2
S' Lateral: 2.5 cm

## 2022-06-16 NOTE — Telephone Encounter (Signed)
The patient has been notified of the result and verbalized understanding.  All questions (if any) were answered.  Pt aware I will go ahead and place the order for repeat carotids to be done in one year, and send a message to our [facility] Schedulers to call him back and arrange this appt for that time.  Pt verbalized understanding and agrees with this plan.

## 2022-06-16 NOTE — Telephone Encounter (Signed)
-----   Message from Meriam Sprague, MD sent at 06/15/2022  4:55 PM EDT ----- His right internal carotid artery shows stable narrowing at 60-79% and left is stable at 40-59%. We will continue to monitor with yearly ultrasounds going forward.

## 2022-06-17 DIAGNOSIS — Z951 Presence of aortocoronary bypass graft: Secondary | ICD-10-CM

## 2022-06-17 DIAGNOSIS — I25118 Atherosclerotic heart disease of native coronary artery with other forms of angina pectoris: Secondary | ICD-10-CM | POA: Diagnosis not present

## 2022-06-17 DIAGNOSIS — R42 Dizziness and giddiness: Secondary | ICD-10-CM

## 2022-06-28 ENCOUNTER — Other Ambulatory Visit: Payer: Self-pay

## 2022-06-28 DIAGNOSIS — Z79899 Other long term (current) drug therapy: Secondary | ICD-10-CM

## 2022-06-28 DIAGNOSIS — I2510 Atherosclerotic heart disease of native coronary artery without angina pectoris: Secondary | ICD-10-CM

## 2022-06-28 DIAGNOSIS — I779 Disorder of arteries and arterioles, unspecified: Secondary | ICD-10-CM

## 2022-06-28 DIAGNOSIS — Z951 Presence of aortocoronary bypass graft: Secondary | ICD-10-CM

## 2022-06-28 DIAGNOSIS — E7849 Other hyperlipidemia: Secondary | ICD-10-CM

## 2022-06-28 MED ORDER — EZETIMIBE 10 MG PO TABS: 10.0000 mg | ORAL_TABLET | Freq: Every day | ORAL | 3 refills | Status: DC

## 2022-06-29 ENCOUNTER — Telehealth: Payer: Self-pay | Admitting: *Deleted

## 2022-06-29 NOTE — Telephone Encounter (Signed)
-----   Message from Scarlette Ar sent at 06/29/2022 10:03 AM EDT ----- Regarding: RE: repeat carotids in one year per [place] 06/16/23 @ 9:30 ----- Message ----- From: Loa Socks, LPN Sent: 7/86/7672   8:16 AM EDT To: Scarlette Ar; Pricilla Holm; # Subject: repeat carotids in one year per [place]      Dr. Shari Prows wants this pt to go ahead and be scheduled to have his repeat carotids done in one year for follow-up of bilateral carotid disease and stenosis.   Order is in.  Pt is aware you will call to arrange.  Can you please schedule then shoot me the date thereafter?   Thanks a ton, [employer]

## 2022-06-30 NOTE — Progress Notes (Deleted)
Cardiology Office Note:    Date:  06/30/2022   ID:  Scott Marshall, DOB 1951-10-28, MRN [id number]  PCP:  Shade Flood, MD   [facility] Providers Cardiologist:  Tobias Alexander, MD {     Referring MD: Shade Flood, MD    History of Present Illness:    Scott Marshall is a 71 y.o. male with a hx of CAD s/p CABG in 2012, HLD, and carotid artery disease who was previously followed by Dr. Delton See who now presents to clinic for hospital follow-up.  Saw Dr. Delton See on 06/2020 where he was doing very well. Active without anginal symptoms.  Carotid ultrasound that shows stable 60 to 79% stenosis on the right carotid artery and 40 to 59% stenosis on the left carotid artery without symptoms.  Was seen in clinic on 06/2021 where he was doing very well from a CV standpoint. Remained very active without anginal symptoms. CTA neck with 75% stenosis of distal right common carotid which was overall stable. Symptoms were thought to be due to possible dehydration/orthostasis and he was discharged home.  Was last seen in clinic on 06/02/22 where he had several episodes of near syncope which were similar to his anginal equivalent. He underwent LHC on 06/04/22 which showed patent LIMA, RIMA and SVG grafts. There was proximal PDA disease which was stable from prior cath and therefore continued medical management was recommended. Cartotid ultrasound also showed stable 60-79% RICA and 40-59% LICA.   Today, the patient overall feels well. No recurrence of his dizziness. No chest pain, SOB, lightheadedness, dizziness. Able to exert himself with no issues. Blood pressure is well controlled at 110-120s at home.    Past Medical History:  Diagnosis Date   CAD (coronary artery disease)    October, 2012,, catheterization done, CABG   Carotid artery disease (HCC)    Doppler, October, 2012, 60-79% R. ICA   Dyslipidemia    Ejection fraction     EF 65%, catheterization, October, 2012   Fatigue    Hx of CABG     October, 2012, Dr. Dorris Fetch.    Shortness of breath    October, 2012, This was an anginal equivalent    Past Surgical History:  Procedure Laterality Date   CORONARY ARTERY BYPASS GRAFT  09/2011   LEFT HEART CATH AND CORS/GRAFTS ANGIOGRAPHY N/A 06/04/2022   Procedure: LEFT HEART CATH AND CORS/GRAFTS ANGIOGRAPHY;  Surgeon: Corky Crafts, MD;  Location: [facility];  Service: Cardiovascular;  Laterality: N/A;   TONSILLECTOMY      Current Medications: No outpatient medications have been marked as taking for the 07/02/22 encounter (Appointment) with Meriam Sprague, MD.     Allergies:   Patient has no known allergies.   Social History   Socioeconomic History   Marital status: Married    Spouse name: Not on file   Number of children: 0   Years of education: Not on file   Highest education level: Not on file  Occupational History   Occupation: management @ [employer]  Tobacco Use   Smoking status: Former    Types: Cigarettes    Quit date: 12/20/1984    Years since quitting: 37.5   Smokeless tobacco: Never  Substance and Sexual Activity   Alcohol use: Yes    Comment: 2-3 a year maybe   Drug use: No   Sexual activity: Not on file  Other Topics Concern   Not on file  Social History Narrative  Not on file   Social Determinants of Health   Financial Resource Strain: Not on file  Food Insecurity: Not on file  Transportation Needs: Not on file  Physical Activity: Not on file  Stress: Not on file  Social Connections: Not on file     Family History: The patient's family history includes Coronary artery disease in his unknown relative; Dementia in his mother; Heart attack (age of onset: 68) in his father; Hyperlipidemia in his brother; Hypertension in his unknown relative; Stroke in his mother; Thyroid disease in his mother.  ROS:   Please see the history of present illness. (+) Vertigo (+) Lightheadedness  (+) Near Syncope (+) Nauseous (+)  Chills All other systems are reviewed and negative.     EKGs/Labs/Other Studies Reviewed:    The following studies were reviewed today: TTE July 06, 2022: IMPRESSIONS     1. Left ventricular ejection fraction, by estimation, is 60 to 65%. The  left ventricle has normal function. The left ventricle has no regional  wall motion abnormalities. Left ventricular diastolic parameters were  normal.   2. Right ventricular systolic function is normal. The right ventricular  size is normal. Tricuspid regurgitation signal is inadequate for assessing  PA pressure.   3. The mitral valve is normal in structure. Trivial mitral valve  regurgitation. No evidence of mitral stenosis.   4. The aortic valve is tricuspid. Aortic valve regurgitation is not  visualized. Aortic valve sclerosis/calcification is present, without any  evidence of aortic stenosis.   5. The inferior vena cava is normal in size with greater than 50%  respiratory variability, suggesting right atrial pressure of 3 mmHg.   Carotid Ultrasound 06/15/22: Summary:  Right Carotid: Velocities in the right ICA are consistent with a stable  60-79%                 stenosis.   Left Carotid: Velocities in the left ICA are consistent with a stable  40-59%                stenosis.   Vertebrals:  Bilateral vertebral arteries demonstrate antegrade flow.  Subclavians: Normal flow hemodynamics were seen in bilateral subclavian               arteries.    Cath 06/04/22:   Mid LM to Dist LM lesion is 99% stenosed.   Ost Cx to Prox Cx lesion is 90% stenosed.  RIMA to OM is patent.   Ost LAD to Prox LAD lesion is 99% stenosed.  LIMA to LAD is patent.   Prox RCA to Mid RCA lesion is 100% stenosed.  SVG to PDA is patent.   RPDA lesion is 90% stenosed.   The left ventricular systolic function is normal.   LV end diastolic pressure is mildly elevated.   The left ventricular ejection fraction is 55-65% by visual estimate.   There is no aortic valve  stenosis.   Patent LIMA, RIMA and SVG grafts.  There is proximal PDA disease which may limit flow into the posterolateral artery.  I reviewed the films from 2012.  This is unchanged.  Therefore, no clear target for PCI.  Continue medical therapy and investigation of other causes of lightheadedness  Carotid Ultrasound July 06, 2021: Summary:  Right Carotid: Velocities in the right ICA are consistent with a 60-79% stenosis. Non-hemodynamically significant plaque <50% noted  \In the CCA. Stable RICA velocities.   Left Carotid: Velocities in the left ICA are consistent with a 40-59%  stenosis. Non-hemodynamically significant  plaque <50% noted in the  CCA. Stable LICA velocities; stenosis based on PSV and plaque formation, low end range.   Vertebrals:  Bilateral vertebral arteries demonstrate antegrade flow.  Subclavians: Normal flow hemodynamics were seen in bilateral subclavian arteries.   EKG:   06/02/2022 EKG: EKG was not ordered.   06/30/2021: Sinus bradycardia, HR 54 bpm, RBBB  Recent Labs: 05/29/2022: ALT 27 06/02/2022: BUN 21; Creatinine, Ser 1.17; Hemoglobin 15.5; Platelets 141; Potassium 4.3; Sodium 137  Recent Lipid Panel    Component Value Date/Time   CHOL 142 08/10/2021 1037   TRIG 82 08/10/2021 1037   HDL 39 (L) 08/10/2021 1037   CHOLHDL 3.6 08/10/2021 1037   CHOLHDL 4.9 12/10/2015 0929   VLDL 11 12/10/2015 0929   LDLCALC 87 08/10/2021 1037         Physical Exam:    VS:  There were no vitals taken for this visit.    Wt Readings from Last 3 Encounters:  06/04/22 175 lb (79.4 kg)  06/02/22 176 lb 6.4 oz (80 kg)  05/29/22 177 lb (80.3 kg)     GEN: Well nourished, well developed in no acute distress HEENT: Normal NECK: No JVD; +right carotid bruit CARDIAC: Regular rhythm, bradycardic, no murmurs, rubs, gallops RESPIRATORY:  Clear to auscultation without rales, wheezing or rhonchi  ABDOMEN: Soft, non-tender, non-distended MUSCULOSKELETAL:  No edema; No deformity   SKIN: Warm and dry NEUROLOGIC:  Alert and oriented x 3 PSYCHIATRIC:  Normal affect   ASSESSMENT:    No diagnosis found.  PLAN:    In order of problems listed above:  #CAD s/p CABG in 2012: Cath 06/04/22 with severe native vessel disease but patient LIMA, RIMA and SVG grafts. Has a stable PDA lesion that was unchanged from 2012. Recommended for continued medical therapy -Continue ASA 81mg  daily -Continue lipitor 80mg  daily  #Dizziness: Unclear etiology. Cath with stable disease. TTE with normal BiV function and no significant valve disease. Cardiac monitor ***  #Carotid artery stenosis: Stable 40-59% on left and 75% on right. Stable on CT in 05/2022. Follows with vascular surgery. -Continue ASA 81mg  daily -Continue lipitor 80mg  daily -Has follow-up with vascular  #White coat HTN: Controlled at home mainly running 110/60s.   #HLD: -Continue lipitor 80mg  daily -Continue zetia 10mg  daily   Medication Adjustments/Labs and Tests Ordered: Current medicines are reviewed at length with the patient today.  Concerns regarding medicines are outlined above.  No orders of the defined types were placed in this encounter.  No orders of the defined types were placed in this encounter.   There are no Patient Instructions on file for this visit.

## 2022-07-02 ENCOUNTER — Ambulatory Visit: Payer: Medicare Other | Admitting: Cardiology

## 2022-07-02 ENCOUNTER — Encounter: Payer: Self-pay | Admitting: Cardiology

## 2022-07-02 ENCOUNTER — Other Ambulatory Visit: Payer: Medicare Other

## 2022-07-02 ENCOUNTER — Other Ambulatory Visit (HOSPITAL_COMMUNITY): Payer: Medicare Other

## 2022-07-02 VITALS — BP 138/80 | HR 61 | Ht 67.0 in | Wt 176.6 lb

## 2022-07-02 DIAGNOSIS — I2510 Atherosclerotic heart disease of native coronary artery without angina pectoris: Secondary | ICD-10-CM

## 2022-07-02 DIAGNOSIS — E782 Mixed hyperlipidemia: Secondary | ICD-10-CM | POA: Diagnosis not present

## 2022-07-02 DIAGNOSIS — Z951 Presence of aortocoronary bypass graft: Secondary | ICD-10-CM

## 2022-07-02 DIAGNOSIS — Z79899 Other long term (current) drug therapy: Secondary | ICD-10-CM

## 2022-07-02 DIAGNOSIS — I25118 Atherosclerotic heart disease of native coronary artery with other forms of angina pectoris: Secondary | ICD-10-CM

## 2022-07-02 DIAGNOSIS — E7849 Other hyperlipidemia: Secondary | ICD-10-CM

## 2022-07-02 DIAGNOSIS — R7989 Other specified abnormal findings of blood chemistry: Secondary | ICD-10-CM

## 2022-07-02 DIAGNOSIS — R42 Dizziness and giddiness: Secondary | ICD-10-CM

## 2022-07-02 DIAGNOSIS — I779 Disorder of arteries and arterioles, unspecified: Secondary | ICD-10-CM

## 2022-07-02 LAB — COMPREHENSIVE METABOLIC PANEL
ALT: 25 IU/L (ref 0–44)
AST: 25 IU/L (ref 0–40)
Albumin/Globulin Ratio: 2.3 — ABNORMAL HIGH (ref 1.2–2.2)
Albumin: 4.4 g/dL (ref 3.9–4.9)
Alkaline Phosphatase: 86 IU/L (ref 44–121)
BUN/Creatinine Ratio: 18 (ref 10–24)
BUN: 21 mg/dL (ref 8–27)
Bilirubin Total: 0.8 mg/dL (ref 0.0–1.2)
CO2: 23 mmol/L (ref 20–29)
Calcium: 8.9 mg/dL (ref 8.6–10.2)
Chloride: 107 mmol/L — ABNORMAL HIGH (ref 96–106)
Creatinine, Ser: 1.14 mg/dL (ref 0.76–1.27)
Globulin, Total: 1.9 g/dL (ref 1.5–4.5)
Glucose: 108 mg/dL — ABNORMAL HIGH (ref 70–99)
Potassium: 4.2 mmol/L (ref 3.5–5.2)
Sodium: 140 mmol/L (ref 134–144)
Total Protein: 6.3 g/dL (ref 6.0–8.5)
eGFR: 70 mL/min/{1.73_m2} (ref >59)

## 2022-07-02 LAB — LIPID PANEL
Chol/HDL Ratio: 3.7 ratio (ref 0.0–5.0)
Cholesterol, Total: 129 mg/dL (ref 100–199)
HDL: 35 mg/dL — ABNORMAL LOW (ref >39)
LDL Chol Calc (NIH): 81 mg/dL (ref 0–99)
Triglycerides: 58 mg/dL (ref 0–149)
VLDL Cholesterol Cal: 13 mg/dL (ref 5–40)

## 2022-07-02 LAB — T3, FREE: T3, Free: 3.7 pg/mL (ref 2.0–4.4)

## 2022-07-02 LAB — TSH: TSH: 9.02 u[IU]/mL — ABNORMAL HIGH (ref 0.450–4.500)

## 2022-07-02 LAB — T4, FREE: Free T4: 0.97 ng/dL (ref 0.82–1.77)

## 2022-07-02 NOTE — Progress Notes (Signed)
Cardiology Office Note:    Date:  07/02/2022   ID:  Scott Marshall, DOB 05-24-1951, MRN [id number]  PCP:  Shade Flood, MD   [facility] Providers Cardiologist:  Tobias Alexander, MD {     Referring MD: Shade Flood, MD    History of Present Illness:    Scott Marshall is a 71 y.o. male with a hx of CAD s/p CABG in 2012, HLD, and carotid artery disease who was previously followed by Dr. Delton See who now presents to clinic for hospital follow-up.  Saw Dr. Delton See on 06/2020 where he was doing very well. Active without anginal symptoms.  Carotid ultrasound that shows stable 60 to 79% stenosis on the right carotid artery and 40 to 59% stenosis on the left carotid artery without symptoms.  Was seen in clinic on 06/2021 where he was doing very well from a CV standpoint. Remained very active without anginal symptoms. CTA neck with 75% stenosis of distal right common carotid which was overall stable. Symptoms were thought to be due to possible dehydration/orthostasis and he was discharged home.  Was last seen in clinic on 06/02/22 where he had several episodes of near syncope which were similar to his anginal equivalent. He underwent LHC on 06/04/22 which showed patent LIMA, RIMA and SVG grafts. There was proximal PDA disease which was stable from prior cath and therefore continued medical management was recommended. Carotid ultrasound also showed stable 60-79% RICA and 40-59% LICA.   Today, the patient overall feels well. No recurrence of his dizziness. No chest pain, SOB, lightheadedness, palpitations. Able to exert himself with no issues. Blood pressure is well controlled at 110-120s at home. Tolerating medications. Zio monitor is pending at this time.    Past Medical History:  Diagnosis Date   CAD (coronary artery disease)    October, 2012,, catheterization done, CABG   Carotid artery disease (HCC)    Doppler, October, 2012, 60-79% R. ICA   Dyslipidemia    Ejection fraction     EF  65%, catheterization, October, 2012   Fatigue    Hx of CABG    October, 2012, Dr. Dorris Fetch.    Shortness of breath    October, 2012, This was an anginal equivalent    Past Surgical History:  Procedure Laterality Date   CORONARY ARTERY BYPASS GRAFT  09/2011   LEFT HEART CATH AND CORS/GRAFTS ANGIOGRAPHY N/A 06/04/2022   Procedure: LEFT HEART CATH AND CORS/GRAFTS ANGIOGRAPHY;  Surgeon: Corky Crafts, MD;  Location: [facility];  Service: Cardiovascular;  Laterality: N/A;   TONSILLECTOMY      Current Medications: Current Meds  Medication Sig   aspirin EC 81 MG tablet Take 81 mg by mouth every evening.   atorvastatin (LIPITOR) 80 MG tablet Take 1 tablet (80 mg total) by mouth daily.   ezetimibe (ZETIA) 10 MG tablet Take 1 tablet (10 mg total) by mouth daily.     Allergies:   Patient has no known allergies.   Social History   Socioeconomic History   Marital status: Married    Spouse name: Not on file   Number of children: 0   Years of education: Not on file   Highest education level: Not on file  Occupational History   Occupation: management @ [employer]  Tobacco Use   Smoking status: Former    Types: Cigarettes    Quit date: 12/20/1984    Years since quitting: 37.5   Smokeless tobacco: Never  Substance and  Sexual Activity   Alcohol use: Yes    Comment: 2-3 a year maybe   Drug use: No   Sexual activity: Not on file  Other Topics Concern   Not on file  Social History Narrative   Not on file   Social Determinants of Health   Financial Resource Strain: Not on file  Food Insecurity: Not on file  Transportation Needs: Not on file  Physical Activity: Not on file  Stress: Not on file  Social Connections: Not on file     Family History: The patient's family history includes Coronary artery disease in his unknown relative; Dementia in his mother; Heart attack (age of onset: 43) in his father; Hyperlipidemia in his brother; Hypertension in his unknown  relative; Stroke in his mother; Thyroid disease in his mother.  ROS:   Please see the history of present illness. All other systems are reviewed and negative.     EKGs/Labs/Other Studies Reviewed:    The following studies were reviewed today: TTE 16-Jul-2022: IMPRESSIONS     1. Left ventricular ejection fraction, by estimation, is 60 to 65%. The  left ventricle has normal function. The left ventricle has no regional  wall motion abnormalities. Left ventricular diastolic parameters were  normal.   2. Right ventricular systolic function is normal. The right ventricular  size is normal. Tricuspid regurgitation signal is inadequate for assessing  PA pressure.   3. The mitral valve is normal in structure. Trivial mitral valve  regurgitation. No evidence of mitral stenosis.   4. The aortic valve is tricuspid. Aortic valve regurgitation is not  visualized. Aortic valve sclerosis/calcification is present, without any  evidence of aortic stenosis.   5. The inferior vena cava is normal in size with greater than 50%  respiratory variability, suggesting right atrial pressure of 3 mmHg.   Carotid Ultrasound 06/15/22: Summary:  Right Carotid: Velocities in the right ICA are consistent with a stable  60-79%                 stenosis.   Left Carotid: Velocities in the left ICA are consistent with a stable  40-59%                stenosis.   Vertebrals:  Bilateral vertebral arteries demonstrate antegrade flow.  Subclavians: Normal flow hemodynamics were seen in bilateral subclavian               arteries.    Cath 06/04/22:   Mid LM to Dist LM lesion is 99% stenosed.   Ost Cx to Prox Cx lesion is 90% stenosed.  RIMA to OM is patent.   Ost LAD to Prox LAD lesion is 99% stenosed.  LIMA to LAD is patent.   Prox RCA to Mid RCA lesion is 100% stenosed.  SVG to PDA is patent.   RPDA lesion is 90% stenosed.   The left ventricular systolic function is normal.   LV end diastolic pressure is mildly  elevated.   The left ventricular ejection fraction is 55-65% by visual estimate.   There is no aortic valve stenosis.   Patent LIMA, RIMA and SVG grafts.  There is proximal PDA disease which may limit flow into the posterolateral artery.  I reviewed the films from 2012.  This is unchanged.  Therefore, no clear target for PCI.  Continue medical therapy and investigation of other causes of lightheadedness  Carotid Ultrasound 2021-07-16: Summary:  Right Carotid: Velocities in the right ICA are consistent with a 60-79% stenosis.  Non-hemodynamically significant plaque <50% noted  \In the CCA. Stable RICA velocities.   Left Carotid: Velocities in the left ICA are consistent with a 40-59%  stenosis. Non-hemodynamically significant plaque <50% noted in the  CCA. Stable LICA velocities; stenosis based on PSV and plaque formation, low end range.   Vertebrals:  Bilateral vertebral arteries demonstrate antegrade flow.  Subclavians: Normal flow hemodynamics were seen in bilateral subclavian arteries.   EKG:   07/02/2022 EKG: No new ECG  06/02/2022 EKG: EKG was not ordered.   06/30/2021: Sinus bradycardia, HR 54 bpm, RBBB  Recent Labs: 05/29/2022: ALT 27 06/02/2022: BUN 21; Creatinine, Ser 1.17; Hemoglobin 15.5; Platelets 141; Potassium 4.3; Sodium 137  Recent Lipid Panel    Component Value Date/Time   CHOL 142 08/10/2021 1037   TRIG 82 08/10/2021 1037   HDL 39 (L) 08/10/2021 1037   CHOLHDL 3.6 08/10/2021 1037   CHOLHDL 4.9 12/10/2015 0929   VLDL 11 12/10/2015 0929   LDLCALC 87 08/10/2021 1037         Physical Exam:    VS:  BP 138/80   Pulse 61   Ht 5\' 7"  (1.702 m)   Wt 176 lb 9.6 oz (80.1 kg)   SpO2 94%   BMI 27.66 kg/m     Wt Readings from Last 3 Encounters:  07/02/22 176 lb 9.6 oz (80.1 kg)  06/04/22 175 lb (79.4 kg)  06/02/22 176 lb 6.4 oz (80 kg)     GEN: Well nourished, well developed in no acute distress HEENT: Normal NECK: No JVD; +right carotid bruit CARDIAC: RRR, no  murmurs RESPIRATORY: Clear to auscultation without rales, wheezing or rhonchi  ABDOMEN: Soft, non-tender, non-distended MUSCULOSKELETAL:  No edema; No deformity  SKIN: Warm and dry NEUROLOGIC:  Alert and oriented x 3 PSYCHIATRIC:  Normal affect   ASSESSMENT:    1. Hx of CABG   2. Coronary artery disease involving native coronary artery of native heart with other form of angina pectoris (HCC)   3. Mixed hyperlipidemia   4. Dizziness   5. Bilateral carotid artery disease, unspecified type (HCC)   6. Other hyperlipidemia     PLAN:    In order of problems listed above:  #CAD s/p CABG in 2012: Cath 06/04/22 with severe native vessel disease but patient LIMA, RIMA and SVG grafts. Has a stable PDA lesion that was unchanged from 2012. Recommended for continued medical therapy -Continue ASA 81mg  daily -Continue lipitor 80mg  daily -Continue zetia 10mg  daily  #Dizziness: Unclear etiology. Cath with stable disease. TTE with normal BiV function and no significant valve disease. Cardiac monitor pending.   #Carotid artery stenosis: Stable 40-59% on left and 75% on right. Stable on CT in 05/2022. Follows with vascular surgery. -Continue ASA 81mg  daily -Continue lipitor 80mg  daily -Continue zetia 10mg  daily -Has follow-up with vascular  #White coat HTN: Controlled at home mainly running 110/60s.   #HLD: -Continue lipitor 80mg  daily -Continue zetia 10mg  daily -Repeat lipids today -If LDL>55, will consider nexlezet vs inclisiran  Follow up in 6 months or PRN   Medication Adjustments/Labs and Tests Ordered: Current medicines are reviewed at length with the patient today.  Concerns regarding medicines are outlined above.  No orders of the defined types were placed in this encounter.  No orders of the defined types were placed in this encounter.   I,Tinashe Williams,acting as a for , MD.,have documented all relevant documentation on the behalf of , MD,as directed by  06/2022  Becky Sax, MD while in the presence of Meriam Sprague, MD.   I, Meriam Sprague, MD, have reviewed all documentation for this visit. The documentation on 07/02/22 for the exam, diagnosis, procedures, and orders are all accurate and complete.

## 2022-07-02 NOTE — Patient Instructions (Signed)

## 2022-07-05 ENCOUNTER — Telehealth: Payer: Self-pay | Admitting: *Deleted

## 2022-07-05 DIAGNOSIS — E782 Mixed hyperlipidemia: Secondary | ICD-10-CM

## 2022-07-05 DIAGNOSIS — Z951 Presence of aortocoronary bypass graft: Secondary | ICD-10-CM

## 2022-07-05 DIAGNOSIS — E7849 Other hyperlipidemia: Secondary | ICD-10-CM

## 2022-07-05 DIAGNOSIS — I25118 Atherosclerotic heart disease of native coronary artery with other forms of angina pectoris: Secondary | ICD-10-CM

## 2022-07-05 DIAGNOSIS — E7800 Pure hypercholesterolemia, unspecified: Secondary | ICD-10-CM

## 2022-07-05 DIAGNOSIS — Z79899 Other long term (current) drug therapy: Secondary | ICD-10-CM

## 2022-07-05 NOTE — Telephone Encounter (Signed)
-----   Message from Meriam Sprague, MD sent at 07/02/2022  7:50 PM EDT ----- Kidney function, electrolytes, thyroid function look stable. LDL is elevated at 81 (goal<55). Is he willing to see lipid clinic to discuss options?

## 2022-07-05 NOTE — Telephone Encounter (Signed)
The patient has been notified of the result and verbalized understanding.  All questions (if any) were answered.  Pt aware that I will go ahead and place the referral to lipid clinic in the system and send a message to our [facility] Schedulers to call him back to arrange this appt.  Pt verbalized understanding and agrees with this plan.

## 2022-07-21 NOTE — Progress Notes (Unsigned)
Patient ID: Scott Marshall                 DOB: Sep 15, 1951                    MRN: [id number]     HPI: Scott Marshall is a 71 y.o. male patient referred to lipid clinic by Dr. Shari Prows. PMH is significant for CAD s/p CABG in 2012, HLD, carotid artery disease (stable on CT 05/2022 - 75% on distal right common carotid artery). LHC on 06/04/22 show patent LIMA, RIMA, and SVG grafts, proximal PDA disease stable from prior cath. At recent visit with Dr. Shari Prows patient reports tolerating medications. Patient was previously seen by PharmD about 6 months ago where PCSK9i was discussed. Patient was hesitant and therapy was not started.  Patient presents to lipid clinic today. He reports not wanting to be on an injection at this time. Although states he is not afraid of needles. He is very active walking multiple times per week and is a drummer in a band. Patient reports blood pressure is normal at home (SBP 115 to 118 mmHg), sometimes slightly high in clinic. Overall eats a healthy diet.   Current Medications: atorvastatin 80 mg, ezetimibe 10 mg  Intolerances: none  Risk Factors: CAD, HLD, coronary artery disease  LDL goal: <55   Diet:  No red meat, no fried foods  Drinks mostly water, no soft drinks Drinks sugar free gatorade Breakfast - oatmeal bar from grocery store Lunch/Dinner- salads with chicken with light blue cheese or [place] sandwich/ chicken breast sandwich from home  Exercise: walks 70 minutes 5-6 days per week, professional drummer, not at much strength training   Family History: CAD in unknown relative, Father- MI, Brother - HLD, Mother - stroke   Social History: former smoker (quit 1986), drinks occasionally   Labs: 07/02/22 - LDL 81, HDL 35, TG 58, TC 229 (on atorvastatin 80 mg, ezetimibe 10 mg)   Past Medical History:  Diagnosis Date   CAD (coronary artery disease)    October, 2012,, catheterization done, CABG   Carotid artery disease (HCC)    Doppler, October, 2012,  60-79% R. ICA   Dyslipidemia    Ejection fraction     EF 65%, catheterization, October, 2012   Fatigue    Hx of CABG    October, 2012, Dr. Dorris Fetch.    Shortness of breath    October, 2012, This was an anginal equivalent    Current Outpatient Medications on File Prior to Visit  Medication Sig Dispense Refill   aspirin EC 81 MG tablet Take 81 mg by mouth every evening.     atorvastatin (LIPITOR) 80 MG tablet Take 1 tablet (80 mg total) by mouth daily. 90 tablet 3   ezetimibe (ZETIA) 10 MG tablet Take 1 tablet (10 mg total) by mouth daily. 90 tablet 3   No current facility-administered medications on file prior to visit.    No Known Allergies  Assessment/Plan:  1. Hyperlipidemia - LDL of 81 not at goal of <55. Discussed Nexlizet versus PCSK9i and medication costs. Patient would prefer a pill over an injection but is not afraid of needles. Given this, informed patient on how bempedoic acid works and expected LDL reduction of around 15% on top of a statin. Explained side effects of bempedoic acid including increased uric acid and reviewed that it is generally well tolerated. Will submit prior authorization for Nexlizet. Continue atorvastatin and ezetimibe for now. Once prior authorization  gets approved, plan for patient to stop ezetimibe, continue atorvastatin 80 mg daily and start Nexlizet (bempedoic acid 180 mg/ezetimibe 10 mg) one tablet daily. Will plan to follow up with lipid panel 2-3 months after starting Nexlizet. Will also follow up with a CMP for serum creatinine and LFTs.   Thank you,  Larena Sox, PharmD PGY1 Pharmacy Resident   07/22/2022  9:36 AM   Olene Floss, Pharm.D, BCPS, CPP [facility]  [address], [place], [place] [address]  Phone: [phone]; Fax: [phone]

## 2022-07-22 ENCOUNTER — Telehealth: Payer: Self-pay

## 2022-07-22 ENCOUNTER — Ambulatory Visit: Payer: Medicare Other | Admitting: Pharmacist

## 2022-07-22 DIAGNOSIS — E785 Hyperlipidemia, unspecified: Secondary | ICD-10-CM

## 2022-07-22 DIAGNOSIS — E782 Mixed hyperlipidemia: Secondary | ICD-10-CM

## 2022-07-22 DIAGNOSIS — I25118 Atherosclerotic heart disease of native coronary artery with other forms of angina pectoris: Secondary | ICD-10-CM | POA: Diagnosis not present

## 2022-07-22 MED ORDER — NEXLIZET 180-10 MG PO TABS: 1.0000 | ORAL_TABLET | Freq: Every day | ORAL | 11 refills | Status: DC

## 2022-07-22 NOTE — Telephone Encounter (Signed)
Called patient to inform them that the prior authorization for Nexlizet has been approved through 12/19/22. We sent the prescription to their pharmacy. We scheduled his follow up labs for 10/04/22. Patient aware that he will stop Zetia when he starts Nexlizet.

## 2022-07-22 NOTE — Patient Instructions (Signed)
I will submit a prior authorization for Nexlizet. I will call you when I hear back.  Please call me at [phone] with any questions.

## 2022-10-04 ENCOUNTER — Ambulatory Visit: Payer: Medicare Other | Attending: Cardiology

## 2022-10-04 DIAGNOSIS — E785 Hyperlipidemia, unspecified: Secondary | ICD-10-CM

## 2022-10-05 LAB — COMPREHENSIVE METABOLIC PANEL
ALT: 35 IU/L (ref 0–44)
AST: 27 IU/L (ref 0–40)
Albumin/Globulin Ratio: 2.4 — ABNORMAL HIGH (ref 1.2–2.2)
Albumin: 4 g/dL (ref 3.9–4.9)
Alkaline Phosphatase: 61 IU/L (ref 44–121)
BUN/Creatinine Ratio: 19 (ref 10–24)
BUN: 26 mg/dL (ref 8–27)
Bilirubin Total: 0.6 mg/dL (ref 0.0–1.2)
CO2: 25 mmol/L (ref 20–29)
Calcium: 8.8 mg/dL (ref 8.6–10.2)
Chloride: 103 mmol/L (ref 96–106)
Creatinine, Ser: 1.35 mg/dL — ABNORMAL HIGH (ref 0.76–1.27)
Globulin, Total: 1.7 g/dL (ref 1.5–4.5)
Glucose: 107 mg/dL — ABNORMAL HIGH (ref 70–99)
Potassium: 4.2 mmol/L (ref 3.5–5.2)
Sodium: 139 mmol/L (ref 134–144)
Total Protein: 5.7 g/dL — ABNORMAL LOW (ref 6.0–8.5)
eGFR: 56 mL/min/{1.73_m2} — ABNORMAL LOW (ref >59)

## 2022-10-05 LAB — LIPID PANEL
Chol/HDL Ratio: 3.1 ratio (ref 0.0–5.0)
Cholesterol, Total: 129 mg/dL (ref 100–199)
HDL: 41 mg/dL (ref >39)
LDL Chol Calc (NIH): 78 mg/dL (ref 0–99)
Triglycerides: 41 mg/dL (ref 0–149)
VLDL Cholesterol Cal: 10 mg/dL (ref 5–40)

## 2022-10-05 LAB — APOLIPOPROTEIN B: Apolipoprotein B: 76 mg/dL (ref <90)

## 2023-04-05 NOTE — Progress Notes (Unsigned)
Cardiology Office Note:    Date:  07/02/2022   ID:  Scott Marshall, DOB [birth year], MRN [id number]  PCP:  Shade Flood, MD   [facility] Providers Cardiologist:  Tobias Alexander, MD {     Referring MD: Shade Flood, MD    History of Present Illness:    Scott Marshall is a 72 y.o. male with a hx of CAD s/p CABG in 2012, HLD, and carotid artery disease who was previously followed by Dr. Delton See who now presents to clinic for hospital follow-up.  Saw Dr. Delton See on 06/2020 where he was doing very well. Active without anginal symptoms.  Carotid ultrasound that shows stable 60 to 79% stenosis on the right carotid artery and 40 to 59% stenosis on the left carotid artery without symptoms.  Was seen in clinic on 06/2021 where he was doing very well from a CV standpoint. Remained very active without anginal symptoms. CTA neck with 75% stenosis of distal right common carotid which was overall stable. Symptoms were thought to be due to possible dehydration/orthostasis and he was discharged home.  Seen in clinic on 06/02/22 where he had several episodes of near syncope which were similar to his anginal equivalent. He underwent LHC on 06/04/22 which showed patent LIMA, RIMA and SVG grafts. There was proximal PDA disease which was stable from prior cath and therefore continued medical management was recommended. Carotid ultrasound also showed stable 60-79% RICA and 40-59% LICA.   Was last seen in clinic on 06/2022 where he was doing well from a CV standpoint.   Today, ***   Past Medical History:  Diagnosis Date   CAD (coronary artery disease)    October, 2012,, catheterization done, CABG   Carotid artery disease (HCC)    Doppler, October, 2012, 60-79% R. ICA   Dyslipidemia    Ejection fraction     EF 65%, catheterization, October, 2012   Fatigue    Hx of CABG    October, 2012, Dr. Dorris Fetch.    Shortness of breath    October, 2012, This was an anginal equivalent    Past Surgical  History:  Procedure Laterality Date   CORONARY ARTERY BYPASS GRAFT  09/2011   LEFT HEART CATH AND CORS/GRAFTS ANGIOGRAPHY N/A 06/04/2022   Procedure: LEFT HEART CATH AND CORS/GRAFTS ANGIOGRAPHY;  Surgeon: Corky Crafts, MD;  Location: [facility];  Service: Cardiovascular;  Laterality: N/A;   TONSILLECTOMY      Current Medications: Current Meds  Medication Sig   aspirin EC 81 MG tablet Take 81 mg by mouth every evening.   atorvastatin (LIPITOR) 80 MG tablet Take 1 tablet (80 mg total) by mouth daily.   ezetimibe (ZETIA) 10 MG tablet Take 1 tablet (10 mg total) by mouth daily.     Allergies:   Patient has no known allergies.   Social History   Socioeconomic History   Marital status: Married    Spouse name: Not on file   Number of children: 0   Years of education: Not on file   Highest education level: Not on file  Occupational History   Occupation: management @ [employer]  Tobacco Use   Smoking status: Former    Types: Cigarettes    Quit date: 12/20/1984    Years since quitting: 37.5   Smokeless tobacco: Never  Substance and Sexual Activity   Alcohol use: Yes    Comment: 2-3 a year maybe   Drug use: No   Sexual activity: Not  on file  Other Topics Concern   Not on file  Social History Narrative   Not on file   Social Determinants of Health   Financial Resource Strain: Not on file  Food Insecurity: Not on file  Transportation Needs: Not on file  Physical Activity: Not on file  Stress: Not on file  Social Connections: Not on file     Family History: The patient's family history includes Coronary artery disease in his unknown relative; Dementia in his mother; Heart attack (age of onset: 75) in his father; Hyperlipidemia in his brother; Hypertension in his unknown relative; Stroke in his mother; Thyroid disease in his mother.  ROS:   Please see the history of present illness. All other systems are reviewed and negative.     EKGs/Labs/Other Studies  Reviewed:    The following studies were reviewed today: TTE June 28, 2022: IMPRESSIONS     1. Left ventricular ejection fraction, by estimation, is 60 to 65%. The  left ventricle has normal function. The left ventricle has no regional  wall motion abnormalities. Left ventricular diastolic parameters were  normal.   2. Right ventricular systolic function is normal. The right ventricular  size is normal. Tricuspid regurgitation signal is inadequate for assessing  PA pressure.   3. The mitral valve is normal in structure. Trivial mitral valve  regurgitation. No evidence of mitral stenosis.   4. The aortic valve is tricuspid. Aortic valve regurgitation is not  visualized. Aortic valve sclerosis/calcification is present, without any  evidence of aortic stenosis.   5. The inferior vena cava is normal in size with greater than 50%  respiratory variability, suggesting right atrial pressure of 3 mmHg.   Carotid Ultrasound 06/15/22: Summary:  Right Carotid: Velocities in the right ICA are consistent with a stable  60-79%                 stenosis.   Left Carotid: Velocities in the left ICA are consistent with a stable  40-59%                stenosis.   Vertebrals:  Bilateral vertebral arteries demonstrate antegrade flow.  Subclavians: Normal flow hemodynamics were seen in bilateral subclavian               arteries.    Cath 06/04/22:   Mid LM to Dist LM lesion is 99% stenosed.   Ost Cx to Prox Cx lesion is 90% stenosed.  RIMA to OM is patent.   Ost LAD to Prox LAD lesion is 99% stenosed.  LIMA to LAD is patent.   Prox RCA to Mid RCA lesion is 100% stenosed.  SVG to PDA is patent.   RPDA lesion is 90% stenosed.   The left ventricular systolic function is normal.   LV end diastolic pressure is mildly elevated.   The left ventricular ejection fraction is 55-65% by visual estimate.   There is no aortic valve stenosis.   Patent LIMA, RIMA and SVG grafts.  There is proximal PDA disease  which may limit flow into the posterolateral artery.  I reviewed the films from 2012.  This is unchanged.  Therefore, no clear target for PCI.  Continue medical therapy and investigation of other causes of lightheadedness  Carotid Ultrasound 2021-06-28: Summary:  Right Carotid: Velocities in the right ICA are consistent with a 60-79% stenosis. Non-hemodynamically significant plaque <50% noted  \In the CCA. Stable RICA velocities.   Left Carotid: Velocities in the left ICA are consistent with a  40-59%  stenosis. Non-hemodynamically significant plaque <50% noted in the  CCA. Stable LICA velocities; stenosis based on PSV and plaque formation, low end range.   Vertebrals:  Bilateral vertebral arteries demonstrate antegrade flow.  Subclavians: Normal flow hemodynamics were seen in bilateral subclavian arteries.   EKG:   ***  Recent Labs: 05/29/2022: ALT 27 06/02/2022: BUN 21; Creatinine, Ser 1.17; Hemoglobin 15.5; Platelets 141; Potassium 4.3; Sodium 137  Recent Lipid Panel    Component Value Date/Time   CHOL 142 08/10/2021 1037   TRIG 82 08/10/2021 1037   HDL 39 (L) 08/10/2021 1037   CHOLHDL 3.6 08/10/2021 1037   CHOLHDL 4.9 12/10/2015 0929   VLDL 11 12/10/2015 0929   LDLCALC 87 08/10/2021 1037         Physical Exam:    VS:  BP 138/80   Pulse 61   Ht  (1.702 m)   Wt 176 lb 9.6 oz (80.1 kg)   SpO2 94%   BMI 27.66 kg/m     Wt Readings from Last 3 Encounters:  07/02/22 176 lb 9.6 oz (80.1 kg)  06/04/22 175 lb (79.4 kg)  06/02/22 176 lb 6.4 oz (80 kg)     GEN: Well nourished, well developed in no acute distress HEENT: Normal NECK: No JVD; +right carotid bruit CARDIAC: RRR, no murmurs RESPIRATORY: Clear to auscultation without rales, wheezing or rhonchi  ABDOMEN: Soft, non-tender, non-distended MUSCULOSKELETAL:  No edema; No deformity  SKIN: Warm and dry NEUROLOGIC:  Alert and oriented x 3 PSYCHIATRIC:  Normal affect   ASSESSMENT:    1. Hx of CABG   2. Coronary  artery disease involving native coronary artery of native heart with other form of angina pectoris (HCC)   3. Mixed hyperlipidemia   4. Dizziness   5. Bilateral carotid artery disease, unspecified type (HCC)   6. Other hyperlipidemia     PLAN:    In order of problems listed above:  #CAD s/p CABG in 2012: Cath 06/04/22 with severe native vessel disease but patient LIMA, RIMA and SVG grafts. Has a stable PDA lesion that was unchanged from 2012. Recommended for continued medical therapy -Continue ASA  daily -Continue lipitor  daily -Continue zetia  daily  #Dizziness: Unclear etiology. Cath with stable disease. TTE with normal BiV function and no significant valve disease. Cardiac monitor pending.   #Carotid artery stenosis: Stable 40-59% on left and 75% on right. Stable on CT in 05/2022. Follows with vascular surgery. -Continue ASA  daily -Continue lipitor  daily -Continue zetia  daily -Has follow-up with vascular  #White coat HTN: Controlled at home mainly running 110/60s.   #HLD: -Continue lipitor  daily -Continue nexlizet 180-10mg  daily  Follow up in 6 months or PRN   Medication Adjustments/Labs and Tests Ordered: Current medicines are reviewed at length with the patient today.  Concerns regarding medicines are outlined above.  No orders of the defined types were placed in this encounter.  No orders of the defined types were placed in this encounter.

## 2023-04-07 ENCOUNTER — Ambulatory Visit: Payer: Medicare Other | Attending: Cardiology | Admitting: Cardiology

## 2023-04-07 ENCOUNTER — Encounter: Payer: Self-pay | Admitting: Cardiology

## 2023-04-07 VITALS — BP 136/72 | HR 58 | Ht 67.0 in | Wt 174.6 lb

## 2023-04-07 DIAGNOSIS — Z951 Presence of aortocoronary bypass graft: Secondary | ICD-10-CM | POA: Diagnosis not present

## 2023-04-07 DIAGNOSIS — E782 Mixed hyperlipidemia: Secondary | ICD-10-CM

## 2023-04-07 DIAGNOSIS — I25118 Atherosclerotic heart disease of native coronary artery with other forms of angina pectoris: Secondary | ICD-10-CM | POA: Diagnosis not present

## 2023-04-07 DIAGNOSIS — I779 Disorder of arteries and arterioles, unspecified: Secondary | ICD-10-CM

## 2023-04-07 DIAGNOSIS — E7800 Pure hypercholesterolemia, unspecified: Secondary | ICD-10-CM

## 2023-04-07 DIAGNOSIS — Z79899 Other long term (current) drug therapy: Secondary | ICD-10-CM

## 2023-04-07 MED ORDER — EZETIMIBE 10 MG PO TABS: 10.0000 mg | ORAL_TABLET | Freq: Every day | ORAL | 2 refills | Status: DC

## 2023-04-07 MED ORDER — ROSUVASTATIN CALCIUM 40 MG PO TABS: 40.0000 mg | ORAL_TABLET | Freq: Every day | ORAL | 2 refills | Status: DC

## 2023-04-07 NOTE — Patient Instructions (Signed)
Medication Instructions:   STOP TAKING ATORVASTATIN NOW  STOP TAKING NEXLIZET NOW  START TAKING ROSUVASTATIN (CRESTOR) 40 MG BY MOUTH DAILY  START TAKING ZETIA 10 MG BY MOUTH DAILY  *If you need a refill on your cardiac medications before your next appointment, please call your pharmacy*   Lab Work:  IN 3 MONTHS HERE IN THE OFFICE--LIPIDS  If you have labs (blood work) drawn today and your tests are completely normal, you will receive your results only by: MyChart Message (if you have MyChart) OR A paper copy in the mail If you have any lab test that is abnormal or we need to change your treatment, we will call you to review the results.     Follow-Up: At [facility], you and your health needs are our priority.  As part of our continuing mission to provide you with exceptional heart care, we have created designated Provider Care Teams.  These Care Teams include your primary Cardiologist (physician) and Advanced Practice Providers (APPs -  Physician Assistants and Nurse Practitioners) who all work together to provide you with the care you need, when you need it.  We recommend signing up for the patient portal called "MyChart".  Sign up information is provided on this After Visit Summary.  MyChart is used to connect with patients for Virtual Visits (Telemedicine).  Patients are able to view lab/test results, encounter notes, upcoming appointments, etc.  Non-urgent messages can be sent to your provider as well.   To learn more about what you can do with MyChart, go to [email or web address].    Your next appointment:   6 month(s)  Provider:   DR. Shari Prows

## 2023-06-16 ENCOUNTER — Ambulatory Visit (HOSPITAL_COMMUNITY)
Admission: RE | Admit: 2023-06-16 | Discharge: 2023-06-16 | Disposition: A | Discharge status: Home / Self Care | Payer: Medicare Other | Source: Ambulatory Visit | Attending: Cardiology | Admitting: Cardiology

## 2023-06-16 DIAGNOSIS — I6523 Occlusion and stenosis of bilateral carotid arteries: Secondary | ICD-10-CM | POA: Diagnosis present

## 2023-06-16 DIAGNOSIS — I779 Disorder of arteries and arterioles, unspecified: Secondary | ICD-10-CM | POA: Diagnosis not present

## 2023-07-04 ENCOUNTER — Ambulatory Visit: Payer: Medicare Other | Attending: Cardiology

## 2023-07-04 DIAGNOSIS — I25118 Atherosclerotic heart disease of native coronary artery with other forms of angina pectoris: Secondary | ICD-10-CM

## 2023-07-04 DIAGNOSIS — I779 Disorder of arteries and arterioles, unspecified: Secondary | ICD-10-CM

## 2023-07-04 DIAGNOSIS — Z951 Presence of aortocoronary bypass graft: Secondary | ICD-10-CM

## 2023-07-04 DIAGNOSIS — E7800 Pure hypercholesterolemia, unspecified: Secondary | ICD-10-CM

## 2023-07-04 DIAGNOSIS — Z79899 Other long term (current) drug therapy: Secondary | ICD-10-CM

## 2023-07-04 DIAGNOSIS — E782 Mixed hyperlipidemia: Secondary | ICD-10-CM

## 2023-07-05 LAB — LIPID PANEL
Chol/HDL Ratio: 3.8 ratio (ref 0.0–5.0)
Cholesterol, Total: 130 mg/dL (ref 100–199)
HDL: 34 mg/dL — ABNORMAL LOW (ref >39)
LDL Chol Calc (NIH): 80 mg/dL (ref 0–99)
Triglycerides: 78 mg/dL (ref 0–149)
VLDL Cholesterol Cal: 16 mg/dL (ref 5–40)

## 2023-10-20 ENCOUNTER — Ambulatory Visit: Payer: Medicare Other | Admitting: Interventional Cardiology

## 2023-11-15 NOTE — Progress Notes (Unsigned)
No chief complaint on file.  History of Present Illness: 72 yo male with history of CAD s/p 3V CABG in 2012, HLD and carotid artery disease who is here today for cardiac follow up. He had been followed in our office by Dr. Delton See and then by Dr. Shari Prows. Cardiac cath in June 2023 with 3/3 patent bypass grafts (LIMA to LAD, RIMA to OM, SVG to PDA) with disease in the proximal PDA which limits flow into the posterolateral artery but unchanged and no stent was placed. Echo June 2023 with LVEF=60-65%. Normal RV function. No significant valve disease. Cardiac monitor in July 2023 with sinus, short runs of SVT. He is known to have carotid artery disease. Carotid dopplers June 2024 with moderate RICA stenosis and mild LICA stenosis.   He is here today for follow up. The patient denies any chest pain, dyspnea, palpitations, lower extremity edema, orthopnea, PND, dizziness, near syncope or syncope.   Primary Care Physician: No primary care provider on file.   Past Medical History:  Diagnosis Date   CAD (coronary artery disease)    October, 2012,, catheterization done, CABG   Carotid artery disease (HCC)    Doppler, October, 2012, 60-79% R. ICA   Dyslipidemia    Ejection fraction     EF 65%, catheterization, October, 2012   Fatigue    Hx of CABG    October, 2012, Dr. Dorris Fetch.    Shortness of breath    October, 2012, This was an anginal equivalent    Past Surgical History:  Procedure Laterality Date   CORONARY ARTERY BYPASS GRAFT  09/2011   LEFT HEART CATH AND CORS/GRAFTS ANGIOGRAPHY N/A 06/04/2022   Procedure: LEFT HEART CATH AND CORS/GRAFTS ANGIOGRAPHY;  Surgeon: Corky Crafts, MD;  Location: [facility];  Service: Cardiovascular;  Laterality: N/A;   TONSILLECTOMY      Current Outpatient Medications  Medication Sig Dispense Refill   aspirin EC 81 MG tablet Take 81 mg by mouth every evening.     ezetimibe (ZETIA) 10 MG tablet Take 1 tablet (10 mg total) by mouth  daily. 90 tablet 2   rosuvastatin (CRESTOR) 40 MG tablet Take 1 tablet (40 mg total) by mouth daily. 90 tablet 2   No current facility-administered medications for this visit.    No Known Allergies  Social History   Socioeconomic History   Marital status: Married    Spouse name: Not on file   Number of children: 0   Years of education: Not on file   Highest education level: Not on file  Occupational History   Occupation: management @ [employer]  Tobacco Use   Smoking status: Former    Current packs/day: 0.00    Types: Cigarettes    Quit date: 12/20/1984    Years since quitting: 38.9   Smokeless tobacco: Never  Substance and Sexual Activity   Alcohol use: Yes    Comment: 2-3 a year maybe   Drug use: No   Sexual activity: Not on file  Other Topics Concern   Not on file  Social History Narrative   Not on file   Social Determinants of Health   Financial Resource Strain: Not on file  Food Insecurity: Not on file  Transportation Needs: Not on file  Physical Activity: Not on file  Stress: Not on file  Social Connections: Not on file  Intimate Partner Violence: Not on file    Family History  Problem Relation Age of Onset   Heart  attack Father 65   Hyperlipidemia Brother    Stroke Mother    Thyroid disease Mother    Dementia Mother    Coronary artery disease Unknown    Hypertension Unknown     Review of Systems:  As stated in the HPI and otherwise negative.   There were no vitals taken for this visit.  Physical Examination: General: Well developed, well nourished, NAD  HEENT: OP clear, mucus membranes moist  SKIN: warm, dry. No rashes. Neuro: No focal deficits  Musculoskeletal: Muscle strength 5/5 all ext  Psychiatric: Mood and affect normal  Neck: No JVD, no carotid bruits, no thyromegaly, no lymphadenopathy.  Lungs:Clear bilaterally, no wheezes, rhonci, crackles Cardiovascular: Regular rate and rhythm. No murmurs, gallops or rubs. Abdomen:Soft. Bowel  sounds present. Non-tender.  Extremities: No lower extremity edema. Pulses are 2 + in the bilateral DP/PT.  EKG:  EKG {ACTION; IS/IS EXB:[id number]} ordered today. The ekg ordered today demonstrates ***  Recent Labs: No results found for requested labs within last 365 days.   Lipid Panel    Component Value Date/Time   CHOL 130 07/04/2023 0851   TRIG 78 07/04/2023 0851   HDL 34 (L) 07/04/2023 0851   CHOLHDL 3.8 07/04/2023 0851   CHOLHDL 4.9 12/10/2015 0929   VLDL 11 12/10/2015 0929   LDLCALC 80 07/04/2023 0851     Wt Readings from Last 3 Encounters:  04/07/23 79.2 kg  07/02/22 80.1 kg  06/04/22 79.4 kg      Assessment and Plan:   1. CAD s/p CABG without angina: He has no chest pain suggestive of angina. Will continue ASA, statin and Zetia.   2. Carotid artery disease: Stable by dopplers June 2024. Repeat dopplers June 2025.   3. HTN: White coat HTN. BP is ***  4. HLD: Goal LDL under 70. He is on Crestor 40 mg daily and Zetia 10 mg daily. Last LDL 80 in June 2024. ***  Labs/ tests ordered today include:  No orders of the defined types were placed in this encounter.    Disposition:   F/U with me in ***    Signed, Verne Carrow, MD, [facility] 11/15/2023 1:06 PM    [facility] [address] [place], [place], [place]  [address] Phone: [phone]; Fax: [phone]

## 2023-11-16 ENCOUNTER — Encounter: Payer: Self-pay | Admitting: Cardiovascular Disease

## 2023-11-16 ENCOUNTER — Ambulatory Visit: Payer: Medicare Other | Attending: Cardiovascular Disease | Admitting: Cardiovascular Disease

## 2023-11-16 VITALS — BP 140/82 | HR 61 | Ht 67.0 in | Wt 175.8 lb

## 2023-11-16 DIAGNOSIS — E782 Mixed hyperlipidemia: Secondary | ICD-10-CM | POA: Diagnosis not present

## 2023-11-16 DIAGNOSIS — I2510 Atherosclerotic heart disease of native coronary artery without angina pectoris: Secondary | ICD-10-CM

## 2023-11-16 DIAGNOSIS — I779 Disorder of arteries and arterioles, unspecified: Secondary | ICD-10-CM

## 2023-11-16 NOTE — Patient Instructions (Signed)
Medication Instructions:  No changes *If you need a refill on your cardiac medications before your next appointment, please call your pharmacy*   Lab Work: none   Testing/Procedures: Your physician has requested that you have a carotid duplex. This test is an ultrasound of the carotid arteries in your neck. It looks at blood flow through these arteries that supply the brain with blood. Allow one hour for this exam. There are no restrictions or special instructions.   Follow-Up: At [facility], you and your health needs are our priority.  As part of our continuing mission to provide you with exceptional heart care, we have created designated Provider Care Teams.  These Care Teams include your primary Cardiologist (physician) and Advanced Practice Providers (APPs -  Physician Assistants and Nurse Practitioners) who all work together to provide you with the care you need, when you need it.   Your next appointment:   12 month(s)  Provider:   Verne Carrow, MD

## 2023-12-20 ENCOUNTER — Other Ambulatory Visit: Payer: Self-pay

## 2023-12-20 DIAGNOSIS — E782 Mixed hyperlipidemia: Secondary | ICD-10-CM

## 2023-12-20 DIAGNOSIS — I779 Disorder of arteries and arterioles, unspecified: Secondary | ICD-10-CM

## 2023-12-20 DIAGNOSIS — Z79899 Other long term (current) drug therapy: Secondary | ICD-10-CM

## 2023-12-20 DIAGNOSIS — E7800 Pure hypercholesterolemia, unspecified: Secondary | ICD-10-CM

## 2023-12-20 DIAGNOSIS — Z951 Presence of aortocoronary bypass graft: Secondary | ICD-10-CM

## 2023-12-20 DIAGNOSIS — I25118 Atherosclerotic heart disease of native coronary artery with other forms of angina pectoris: Secondary | ICD-10-CM

## 2023-12-20 MED ORDER — ROSUVASTATIN CALCIUM 40 MG PO TABS: 40.0000 mg | ORAL_TABLET | Freq: Every day | ORAL | 3 refills | Status: DC

## 2023-12-28 ENCOUNTER — Other Ambulatory Visit: Payer: Self-pay

## 2023-12-28 ENCOUNTER — Ambulatory Visit: Payer: Medicare Other | Admitting: Cardiovascular Disease

## 2023-12-28 DIAGNOSIS — E782 Mixed hyperlipidemia: Secondary | ICD-10-CM

## 2023-12-28 DIAGNOSIS — I25118 Atherosclerotic heart disease of native coronary artery with other forms of angina pectoris: Secondary | ICD-10-CM

## 2023-12-28 DIAGNOSIS — Z951 Presence of aortocoronary bypass graft: Secondary | ICD-10-CM

## 2023-12-28 DIAGNOSIS — Z79899 Other long term (current) drug therapy: Secondary | ICD-10-CM

## 2023-12-28 DIAGNOSIS — E7800 Pure hypercholesterolemia, unspecified: Secondary | ICD-10-CM

## 2023-12-28 DIAGNOSIS — I779 Disorder of arteries and arterioles, unspecified: Secondary | ICD-10-CM

## 2023-12-28 MED ORDER — EZETIMIBE 10 MG PO TABS: 10.0000 mg | ORAL_TABLET | Freq: Every day | ORAL | 3 refills | Status: AC

## 2024-05-20 IMAGING — CT CT ANGIO HEAD
2 of 10 series · 9 of 33 positions shown · non-contrast
Comparison: None Available.

CLINICAL DATA: Carotid artery stenosis.  Dizziness.

EXAM:
CT ANGIOGRAPHY HEAD AND NECK
TECHNIQUE: Multidetector CT imaging of the head and neck was performed using
the standard protocol during bolus administration of intravenous
contrast. Multiplanar CT image reconstructions and MIPs were
obtained to evaluate the vascular anatomy. Carotid stenosis
measurements (when applicable) are obtained utilizing NASCET
criteria, using the distal internal carotid diameter as the
denominator.

[Series 5: cta head neck · axial · 0.43mm/px · z∈[+1216,+1560]mm · 3 of 173 slices shown]
[im 1/173  soft-tissue]
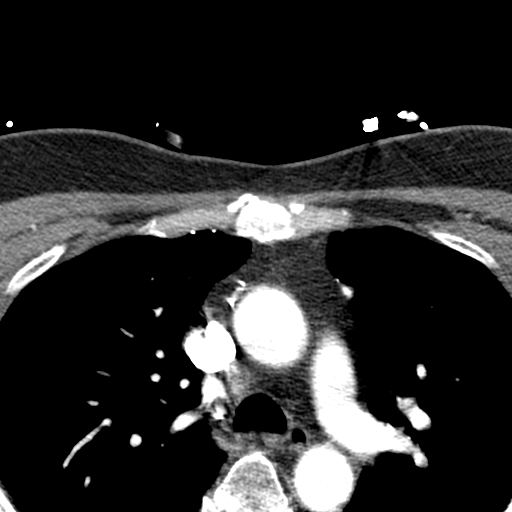
[im 87/173  bone]
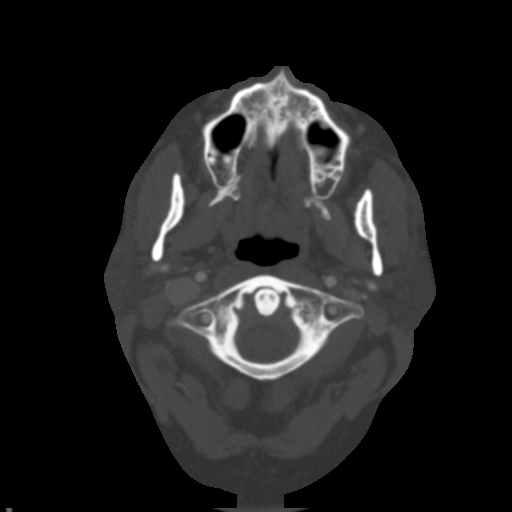
[im 173/173  soft-tissue]
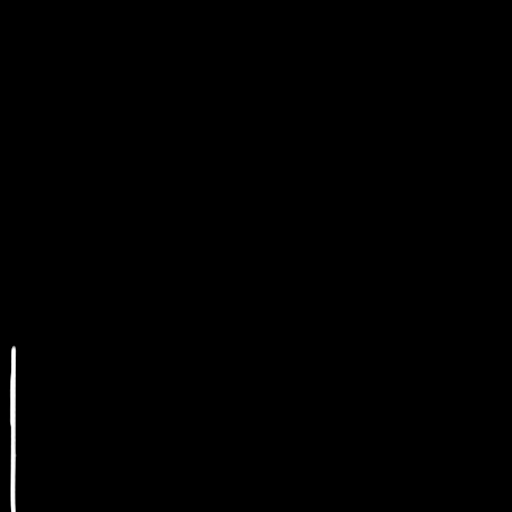

[Series 7: ax thin · axial · 0.39mm/px · z∈[+1228,+1468]mm · 6 of 349 slices shown]
[im 50/349  soft-tissue]
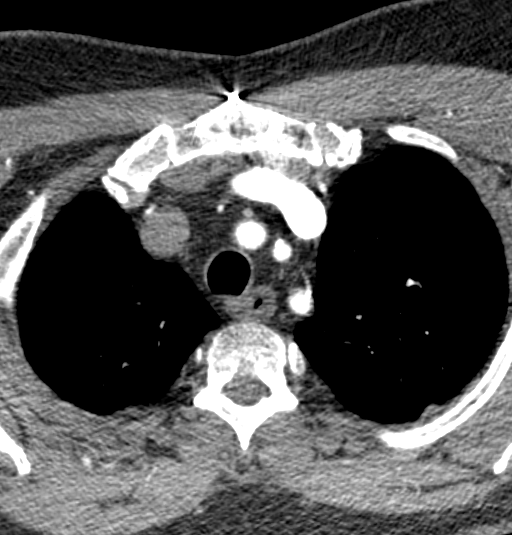
[im 100/349  soft-tissue]
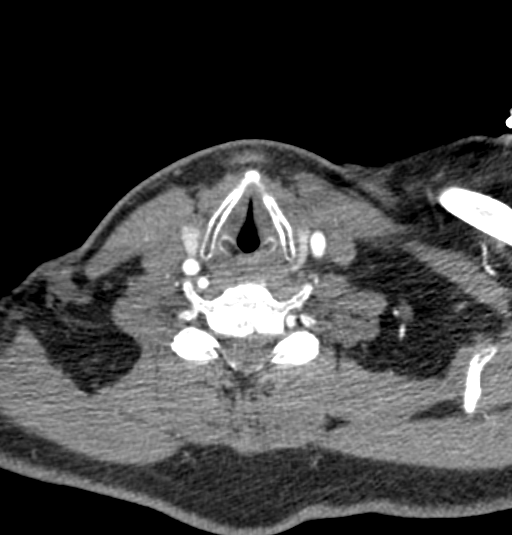
[im 150/349  soft-tissue]
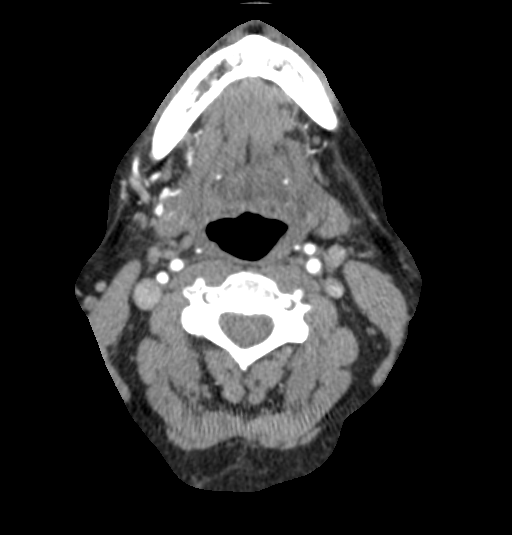
[im 199/349  soft-tissue]
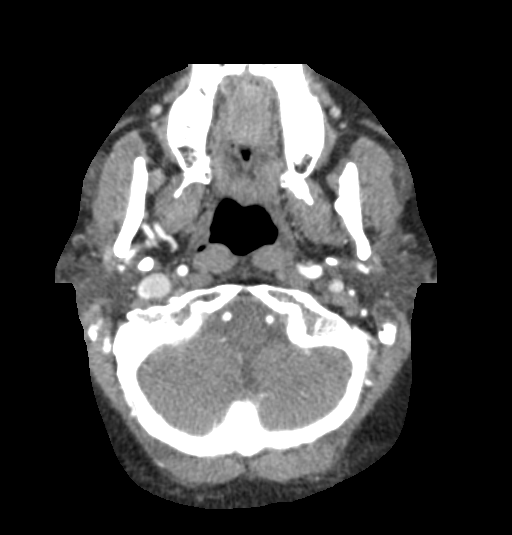
[im 249/349  soft-tissue]
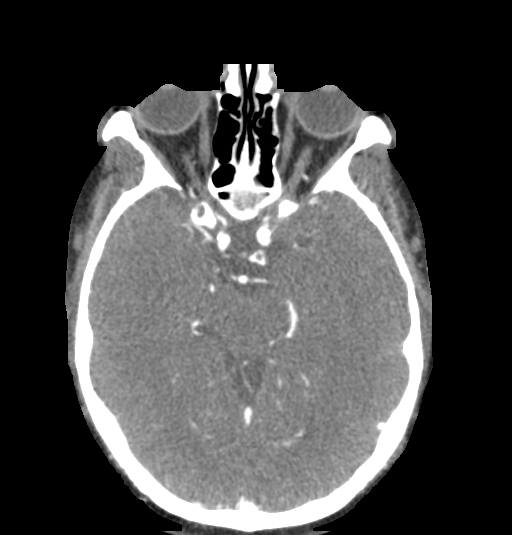
[im 299/349  soft-tissue]
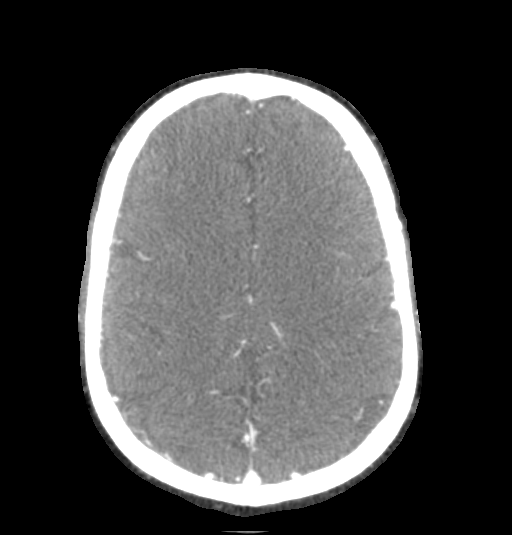

[9 of 33 positions shown; findings below may reference images not displayed]

RADIATION DOSE REDUCTION: This exam was performed according to the
departmental dose-optimization program which includes automated
exposure control, adjustment of the mA and/or kV according to
patient size and/or use of iterative reconstruction technique.

CONTRAST:  75mL OMNIPAQUE IOHEXOL 350 MG/ML SOLN
FINDINGS: CT HEAD FINDINGS

Brain: There is no evidence of an acute infarct, intracranial
hemorrhage, mass, midline shift, or extra-axial fluid collection.
The ventricles and sulci are normal.

Vascular: Calcified atherosclerosis at the skull base.

Skull: No fracture or suspicious osseous lesion.

Sinuses: Minimal mucosal thickening in the left maxillary sinus.
Clear mastoid air cells.

Orbits: Unremarkable.

Review of the MIP images confirms the above findings

CTA NECK FINDINGS

Aortic arch: Standard 3 vessel aortic arch with mild atherosclerotic
plaque. No significant arch vessel origin stenosis.

Right carotid system: Patent with prominent calcified and soft
plaque at the carotid bifurcation resulting in approximately 75%
stenosis of the distal common carotid artery. No significant ICA
stenosis.

Left carotid system: Patent with a moderate amount of calcified
plaque at the carotid bifurcation. No evidence of a significant
stenosis.

Vertebral arteries: Patent with the right being minimally larger
than the left. Calcified plaque at both vertebral origins without
significant stenosis.

Skeleton: Advanced disc degeneration at C4-5 and C6-7.

Other neck: No evidence of cervical lymphadenopathy or mass.

Upper chest: Clear lung apices.

Review of the MIP images confirms the above findings

CTA HEAD FINDINGS

Anterior circulation: The internal carotid arteries are patent from
skull base to carotid termini with a moderate amount of calcified
plaque bilaterally not resulting in significant stenosis. ACAs and
MCAs are patent without evidence of a proximal branch occlusion or
significant proximal stenosis. No aneurysm is identified.

Posterior circulation: The intracranial vertebral arteries are
patent to the basilar with a small amount of nonstenotic calcified
plaque involving the proximal left V4 segment. Patent right PICA,
bilateral AICA, and bilateral SCA origins are visualized. A left
PICA origin is not clearly identified. The basilar artery is widely
patent. There are small posterior communicating arteries
bilaterally. Both PCAs are patent without evidence of a significant
proximal stenosis. No aneurysm is identified.

Venous sinuses: Patent.

Anatomic variants: None.

Review of the MIP images confirms the above findings
IMPRESSION: 1. No evidence of acute intracranial abnormality.
2. 75% stenosis of the distal right common carotid artery.
3. Widely patent posterior circulation.
4. Aortic Atherosclerosis ([id number]).

## 2024-05-30 ENCOUNTER — Ambulatory Visit (HOSPITAL_COMMUNITY)
Admission: RE | Admit: 2024-05-30 | Discharge: 2024-05-30 | Disposition: A | Discharge status: Home / Self Care | Source: Ambulatory Visit | Attending: Cardiovascular Disease | Admitting: Cardiovascular Disease

## 2024-05-30 DIAGNOSIS — I779 Disorder of arteries and arterioles, unspecified: Secondary | ICD-10-CM | POA: Diagnosis not present

## 2024-05-30 DIAGNOSIS — I6523 Occlusion and stenosis of bilateral carotid arteries: Secondary | ICD-10-CM | POA: Diagnosis not present

## 2024-05-31 ENCOUNTER — Ambulatory Visit: Payer: Self-pay | Admitting: Cardiovascular Disease

## 2024-05-31 DIAGNOSIS — I6523 Occlusion and stenosis of bilateral carotid arteries: Secondary | ICD-10-CM

## 2024-06-06 NOTE — Telephone Encounter (Signed)
 Called patient and reviewed carotid study.  Pt has reviewed report himself already.  He has compared it to last years study where it indicated right ICA 60-79%  Most recent study:  Right Carotid: Velocities in the right ICA are consistent with a 80-99% stenosis. Lower end of the spectrum stenosis severity.  Suggest follow up study in 6 months. Compared to the studydone on 06/16/23, right ICA stenosis severity has progressed from < 80%.    I adv I would ask Dr. Abel Hoe to review and then will let patient know if significant change and if/when he would need to see vascular surgery.

## 2024-06-13 NOTE — Telephone Encounter (Signed)
 We can refer him to vascular surgery and have them follow this. Thanks, chris   Referral placed to [facility].

## 2024-06-20 NOTE — Progress Notes (Unsigned)
 Office Note     CC: Bilateral carotid stenosis, right greater than left Requesting Provider:  McAlhany, Christopher D*  HPI: Scott Marshall is a 73 y.o. (1951/01/21) male presenting at the request of .Patient, No Pcp Per for bilateral carotid stenosis, right greater than left.  On exam, Scott Marshall was doing well.  A native of [place], he graduated from [employer].  He is happily married, retired, and continues to live an active, independent lifestyle.  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  They are coming up on their 10-year reunion.  Scott Marshall denies history of TIA, stroke, amaurosis Medical history includes MI with CABG in 2012.  Former smoker, stopped in the late 80s.   Past Medical History:  Diagnosis Date   CAD (coronary artery disease)    October, 2012,, catheterization done, CABG   Carotid artery disease (HCC)    Doppler, October, 2012, 60-79% R. ICA   Dyslipidemia    Ejection fraction     EF 65%, catheterization, October, 2012   Fatigue    Hx of CABG    October, 2012, Dr. Kerrin.    Shortness of breath    October, 2012, This was an anginal equivalent    Past Surgical History:  Procedure Laterality Date   CORONARY ARTERY BYPASS GRAFT  09/2011   LEFT HEART CATH AND CORS/GRAFTS ANGIOGRAPHY N/A 06/04/2022   Procedure: LEFT HEART CATH AND CORS/GRAFTS ANGIOGRAPHY;  Surgeon: Dann Candyce RAMAN, MD;  Location: [facility];  Service: Cardiovascular;  Laterality: N/A;   TONSILLECTOMY      Social History   Socioeconomic History   Marital status: Married    Spouse name: Not on file   Number of children: 0   Years of education: Not on file   Highest education level: Not on file  Occupational History   Occupation: management @ [employer]  Tobacco Use   Smoking status: Former    Current packs/day: 0.00    Types: Cigarettes    Quit date: 12/20/1984    Years since  quitting: 39.5   Smokeless tobacco: Never  Substance and Sexual Activity   Alcohol use: Yes    Comment: 2-3 a year maybe   Drug use: No   Sexual activity: Not on file  Other Topics Concern   Not on file  Social History Narrative   Not on file   Social Drivers of Health   Financial Resource Strain: Not on file  Food Insecurity: Not on file  Transportation Needs: Not on file  Physical Activity: Not on file  Stress: Not on file  Social Connections: Not on file  Intimate Partner Violence: Not on file   Family History  Problem Relation Age of Onset   Heart attack Father 39   Hyperlipidemia Brother    Stroke Mother    Thyroid disease Mother    Dementia Mother    Coronary artery disease Unknown    Hypertension Unknown     Current Outpatient Medications  Medication Sig Dispense Refill   aspirin  EC 81 MG tablet Take 81 mg by mouth every evening.     ezetimibe  (ZETIA ) 10 MG tablet Take 1 tablet (10 mg total) by mouth daily. 90 tablet 3   meloxicam (MOBIC) 15 MG tablet Take 15 mg by mouth daily.     rosuvastatin  (CRESTOR ) 40 MG tablet Take 1 tablet (40 mg total) by mouth daily. 90 tablet  3   No current facility-administered medications for this visit.    No Known Allergies   REVIEW OF SYSTEMS:  [X]  denotes positive finding, [ ]  denotes negative finding Cardiac  Comments:  Chest pain or chest pressure:    Shortness of breath upon exertion:    Short of breath when lying flat:    Irregular heart rhythm:        Vascular    Pain in calf, thigh, or hip brought on by ambulation:    Pain in feet at night that wakes you up from your sleep:     Blood clot in your veins:    Leg swelling:         Pulmonary    Oxygen at home:    Productive cough:     Wheezing:         Neurologic    Sudden weakness in arms or legs:     Sudden numbness in arms or legs:     Sudden onset of difficulty speaking or slurred speech:    Temporary loss of vision in one eye:     Problems with  dizziness:         Gastrointestinal    Blood in stool:     Vomited blood:         Genitourinary    Burning when urinating:     Blood in urine:        Psychiatric    Major depression:         Hematologic    Bleeding problems:    Problems with blood clotting too easily:        Skin    Rashes or ulcers:        Constitutional    Fever or chills:      PHYSICAL EXAMINATION:  There were no vitals filed for this visit.  General:  WDWN in NAD; vital signs documented above Gait: Not observed HENT: WNL, normocephalic Pulmonary: normal non-labored breathing , without wheezing Cardiac: regular HR Abdomen: soft, NT, no masses Skin: without rashes Vascular Exam/Pulses:  Right Left  Radial 2+ (normal) 2+ (normal)  Ulnar    Femoral    Popliteal    DP 2+ (normal) 2+ (normal)  PT     Extremities: without ischemic changes, without Gangrene , without cellulitis; without open wounds;  Musculoskeletal: no muscle wasting or atrophy  Neurologic: A&O X 3;  No focal weakness or paresthesias are detected Psychiatric:  The pt has Normal affect.    Non-Invasive Vascular Imaging:     Right Carotid Findings:  +----------+--------+--------+--------+------------------+---------+           PSV cm/sEDV cm/sStenosisPlaque DescriptionComments   +----------+--------+--------+--------+------------------+---------+  CCA Prox  96      20                                           +----------+--------+--------+--------+------------------+---------+  CCA Mid   59      18                                           +----------+--------+--------+--------+------------------+---------+  CCA Distal113     31              calcific                     +----------+--------+--------+--------+------------------+---------+  ICA Prox  387     102     80-99%  calcific          Shadowing  +----------+--------+--------+--------+------------------+---------+  ICA Mid   163      42                                           +----------+--------+--------+--------+------------------+---------+  ICA Distal48      14                                           +----------+--------+--------+--------+------------------+---------+  ECA      105     25                                           +----------+--------+--------+--------+------------------+---------+   +----------+--------+-------+----------------+-------------------+           PSV cm/sEDV cmsDescribe        Arm Pressure (mmHG)  +----------+--------+-------+----------------+-------------------+  [id number]    10     Multiphasic, WNL140                  +----------+--------+-------+----------------+-------------------+   +---------+--------+--+--------+--+---------+  VertebralPSV cm/s60EDV cm/s13Antegrade  +---------+--------+--+--------+--+---------+      Left Carotid Findings:  +----------+--------+--------+--------+------------------+---------+           PSV cm/sEDV cm/sStenosisPlaque DescriptionComments   +----------+--------+--------+--------+------------------+---------+  CCA Prox  126     29                                           +----------+--------+--------+--------+------------------+---------+  CCA Mid   105     24              heterogenous                 +----------+--------+--------+--------+------------------+---------+  CCA Distal103     33              calcific                     +----------+--------+--------+--------+------------------+---------+  ICA Prox  178     48      40-59%  calcific          Shadowing  +----------+--------+--------+--------+------------------+---------+  ICA Mid   125     33                                           +----------+--------+--------+--------+------------------+---------+  ICA Distal58      21                                            +----------+--------+--------+--------+------------------+---------+  ECA      334     45                                           +----------+--------+--------+--------+------------------+---------+   +----------+--------+--------+----------------+-------------------+  PSV cm/sEDV cm/sDescribe        Arm Pressure (mmHG)  +----------+--------+--------+----------------+-------------------+  Subclavian180    0       Multiphasic, [id number]                  +----------+--------+--------+----------------+-------------------+   +---------+--------+--------+---------+  VertebralPSV cm/sEDV cm/sAntegrade  +---------+--------+--------+---------+      ASSESSMENT/PLAN: Scott Marshall is a 74 y.o. male presenting with bilateral asymptomatic carotid stenosis, right greater than left.  I had a long conversation with Scott Marshall regarding the above.  I evaluated his CT angio head and neck from 05/29/2022, as well as most recent bilateral carotid ultrasound.  The ultrasound demonstrates critical stenosis of the right ICA.  CT scan demonstrates a low bifurcation of the carotid.  My plan is to perform a new CT angio head and neck to further define the lesion.  He is aware that I think he will be best served with right-sided carotid endarterectomy.  My plan is to see him in the office once the CT angio head and neck have been completed for preoperative discussion.  He will be seen by Dr. Medford Cash for preoperative cardiac risk assessment prior to his next visit.  I have added Plavix to his regimen, now consisting of DAP and high intensity statin.  We discussed the signs and symptoms of stroke.  I asked him to call 911 immediately should any of these occur.   Fonda FORBES Rim, MD [facility] [phone]

## 2024-06-21 ENCOUNTER — Encounter: Payer: Self-pay | Admitting: Vascular Surgery

## 2024-06-21 ENCOUNTER — Ambulatory Visit: Attending: Vascular Surgery | Admitting: Vascular Surgery

## 2024-06-21 VITALS — BP 153/84 | HR 66 | Temp 97.8°F | Ht 67.0 in | Wt 179.0 lb

## 2024-06-21 DIAGNOSIS — I6523 Occlusion and stenosis of bilateral carotid arteries: Secondary | ICD-10-CM

## 2024-06-21 MED ORDER — CLOPIDOGREL BISULFATE 75 MG PO TABS: 75.0000 mg | ORAL_TABLET | Freq: Every day | ORAL | 6 refills | Status: DC

## 2024-06-25 ENCOUNTER — Other Ambulatory Visit: Payer: Self-pay

## 2024-06-25 DIAGNOSIS — I6523 Occlusion and stenosis of bilateral carotid arteries: Secondary | ICD-10-CM

## 2024-06-28 ENCOUNTER — Encounter: Admitting: Vascular Surgery

## 2024-07-05 ENCOUNTER — Inpatient Hospital Stay
Admission: RE | Admit: 2024-07-05 | Discharge: 2024-07-05 | Disposition: A | Discharge status: Home / Self Care | Source: Ambulatory Visit | Attending: Vascular Surgery

## 2024-07-05 ENCOUNTER — Ambulatory Visit
Admission: RE | Admit: 2024-07-05 | Discharge: 2024-07-05 | Disposition: A | Discharge status: Home / Self Care | Source: Ambulatory Visit | Attending: Vascular Surgery | Admitting: Vascular Surgery

## 2024-07-05 DIAGNOSIS — I6523 Occlusion and stenosis of bilateral carotid arteries: Secondary | ICD-10-CM

## 2024-07-05 MED ORDER — IOPAMIDOL (ISOVUE-370) INJECTION 76%
80.0000 mL | Freq: Once | INTRAVENOUS | Status: AC | PRN
  Administered 2024-07-05: 80 mL via INTRAVENOUS

## 2024-07-11 ENCOUNTER — Encounter: Payer: Self-pay | Admitting: Emergency Medicine

## 2024-07-11 ENCOUNTER — Ambulatory Visit
Admission: EM | Admit: 2024-07-11 | Discharge: 2024-07-11 | Disposition: A | Discharge status: Home / Self Care | Attending: Emergency Medicine | Admitting: Emergency Medicine

## 2024-07-11 DIAGNOSIS — L239 Allergic contact dermatitis, unspecified cause: Secondary | ICD-10-CM | POA: Diagnosis not present

## 2024-07-11 LAB — COLOGUARD

## 2024-07-11 MED ORDER — TRIAMCINOLONE ACETONIDE 0.1 % EX CREA: 1.0000 | TOPICAL_CREAM | Freq: Two times a day (BID) | CUTANEOUS | 0 refills | Status: DC

## 2024-07-11 MED ORDER — DIPHENHYDRAMINE HCL 25 MG PO TABS: 25.0000 mg | ORAL_TABLET | Freq: Four times a day (QID) | ORAL | 0 refills | Status: DC | PRN

## 2024-07-11 MED ORDER — DEXAMETHASONE SODIUM PHOSPHATE 10 MG/ML IJ SOLN
10.0000 mg | Freq: Once | INTRAMUSCULAR | Status: AC
  Administered 2024-07-11: 10 mg via INTRAMUSCULAR

## 2024-07-11 NOTE — ED Provider Notes (Signed)
 [facility]    CSN: [id number] Arrival date & time: 07/11/24  1417      History   Chief Complaint Chief Complaint  Patient presents with   Allergic Reaction    HPI Scott Marshall is a 73 y.o. male.   Patient presents to clinic for concerns of an itchy rash to bilateral arms.  He did go golfing a few days ago and is unsure if he got exposed to poison ivy or got stung by something.  Usually when he gets stung by something will have a delayed reaction of at least 24 hours and presents similarly.  Did not feel like he was stung, does not remember the sting.  Has never had anaphylaxis and has never used an EpiPen.  Is having right hand swelling and pain, seems to be the start of the rash.  Did notice that his chin was swollen earlier today and had itching behind his right ear.  Has not had any wheezing or shortness of breath.  Denies oral swelling or trouble swallowing.  The history is provided by the patient and medical records.  Allergic Reaction   Past Medical History:  Diagnosis Date   CAD (coronary artery disease)    October, 2012,, catheterization done, CABG   Carotid artery disease (HCC)    Doppler, October, 2012, 60-79% R. ICA   Dyslipidemia    Ejection fraction     EF 65%, catheterization, October, 2012   Fatigue    Hx of CABG    October, 2012, Dr. Kerrin.    Shortness of breath    October, 2012, This was an anginal equivalent    Patient Active Problem List   Diagnosis Date Noted   Elevated TSH 02/02/2017   Hyperlipidemia 01/27/2017   Dyslipidemia    Ejection fraction    Carotid artery disease (HCC)    CAD (coronary artery disease)    Hx of CABG    Shortness of breath    Fatigue     Past Surgical History:  Procedure Laterality Date   CORONARY ARTERY BYPASS GRAFT  09/2011   LEFT HEART CATH AND CORS/GRAFTS ANGIOGRAPHY N/A 06/04/2022   Procedure: LEFT HEART CATH AND CORS/GRAFTS ANGIOGRAPHY;  Surgeon: Dann Candyce RAMAN, MD;  Location: [facility]  INVASIVE CV LAB;  Service: Cardiovascular;  Laterality: N/A;   TONSILLECTOMY         Home Medications    Prior to Admission medications   Medication Sig Start Date End Date Taking? Authorizing Provider  diphenhydrAMINE  (BENADRYL ) 25 MG tablet Take 1 tablet (25 mg total) by mouth every 6 (six) hours as needed. 07/11/24  Yes Lynn Sissel  N, FNP  triamcinolone  cream (KENALOG ) 0.1 % Apply 1 Application topically 2 (two) times daily. 07/11/24  Yes Gail Vendetti  N, FNP  aspirin  EC 81 MG tablet Take 81 mg by mouth every evening.    [provider]  clopidogrel  (PLAVIX ) 75 MG tablet Take 1 tablet (75 mg total) by mouth daily. 06/21/24   Lanis Fonda BRAVO, MD  ezetimibe  (ZETIA ) 10 MG tablet Take 1 tablet (10 mg total) by mouth daily. 12/28/23   Verlin Lonni BIRCH, MD  meloxicam (MOBIC) 15 MG tablet Take 15 mg by mouth daily. 11/10/23   [provider]  rosuvastatin  (CRESTOR ) 40 MG tablet Take 1 tablet (40 mg total) by mouth daily. 12/20/23   Verlin Lonni BIRCH, MD    Family History Family History  Problem Relation Age of Onset   Heart attack Father 82  Hyperlipidemia Brother    Stroke Mother    Thyroid disease Mother    Dementia Mother    Coronary artery disease Unknown    Hypertension Unknown     Social History Social History   Tobacco Use   Smoking status: Former    Current packs/day: 0.00    Types: Cigarettes    Quit date: 12/20/1984    Years since quitting: 39.5   Smokeless tobacco: Never  Substance Use Topics   Alcohol use: Yes    Comment: 2-3 a year maybe   Drug use: No     Allergies   Patient has no known allergies.   Review of Systems Review of Systems  Per HPI  Physical Exam Triage Vital Signs ED Triage Vitals  Encounter Vitals Group     BP 07/11/24 1445 (!) 152/89     Girls Systolic BP Percentile --      Girls Diastolic BP Percentile --      Boys Systolic BP Percentile --      Boys Diastolic BP Percentile --      Pulse  Rate 07/11/24 1445 78     Resp 07/11/24 1445 17     Temp --      Temp src --      SpO2 07/11/24 1445 95 %     Weight --      Height --      Head Circumference --      Peak Flow --      Pain Score 07/11/24 1450 3     Pain Loc --      Pain Education --      Exclude from Growth Chart --    No data found.  Updated Vital Signs BP (!) 152/89   Pulse 78   Resp 17   SpO2 95%   Visual Acuity Right Eye Distance:   Left Eye Distance:   Bilateral Distance:    Right Eye Near:   Left Eye Near:    Bilateral Near:     Physical Exam Vitals and nursing note reviewed.  Constitutional:      Appearance: Normal appearance.  HENT:     Head: Normocephalic and atraumatic.     Right Ear: External ear normal.     Left Ear: External ear normal.     Nose: Nose normal.     Mouth/Throat:     Mouth: Mucous membranes are moist.  Eyes:     Conjunctiva/sclera: Conjunctivae normal.  Cardiovascular:     Rate and Rhythm: Normal rate.  Pulmonary:     Effort: Pulmonary effort is normal. No respiratory distress.  Skin:    Findings: Rash present. Rash is urticarial.     Comments: Urticarial erythematous rash to bilateral forearms, left upper arm, right ear and chin.  Right hand is swollen with similar rash.  Neurological:     General: No focal deficit present.     Mental Status: He is alert and oriented to person, place, and time.  Psychiatric:        Mood and Affect: Mood normal.        Behavior: Behavior normal. Behavior is cooperative.      UC Treatments / Results  Labs (all labs ordered are listed, but only abnormal results are displayed) Labs Reviewed - No data to display  EKG   Radiology No results found.  Procedures Procedures (including critical care time)  Medications Ordered in UC Medications  dexamethasone  (DECADRON ) injection 10 mg (10 mg Intramuscular  Given 07/11/24 1506)    Initial Impression / Assessment and Plan / UC Course  I have reviewed the triage vital signs  and the nursing notes.  Pertinent labs & imaging results that were available during my care of the patient were reviewed by me and considered in my medical decision making (see chart for details).  Vitals and triage reviewed, patient is hemodynamically stable.  Without signs or symptoms of anaphylaxis, able to speak in clear sentences and no oral swelling.  Suspect allergic dermatitis, unclear cause.  Will treat with IM steroid in clinic, no history of type 2 diabetes.  Advised antihistamine and topical steroid.  Plan of care, follow-up care return precautions given, no questions at this time.     Final Clinical Impressions(s) / UC Diagnoses   Final diagnoses:  Allergic dermatitis     Discharge Instructions      The steroid shot should start working in the next 30 minutes to help reduce swelling and inflammation.  You can take 25 mg of Benadryl  every 6-8 hours to help with the localized allergic reaction.  Use the topical steroid cream twice daily, do not use this for longer than 7 days as it can cause skin thinning and skin discoloration.  Return to clinic for any new or urgent symptoms.    ED Prescriptions     Medication Sig Dispense Auth. Provider   diphenhydrAMINE  (BENADRYL ) 25 MG tablet Take 1 tablet (25 mg total) by mouth every 6 (six) hours as needed. 30 tablet Dreama, Mort Smelser  N, FNP   triamcinolone  cream (KENALOG ) 0.1 % Apply 1 Application topically 2 (two) times daily. 30 g Dreama, Caedmon Louque  N, FNP      PDMP not reviewed this encounter.   Dreama Enrique SAILOR, FNP 07/11/24 [phone]

## 2024-07-11 NOTE — ED Triage Notes (Addendum)
 Pt states he went golfing yesterday and afterward notice rash on arms. Last night began having swelling and itching in hands. Now swelling increased and swelling.   Pt states he did notice today he and small red spot on right palm and wonders if he was stung yesterday. States in the past he has delayed reaction to bee stings.   He has taken Claritin and use benadryl  cream. Denies SOB or throat swelling.

## 2024-07-11 NOTE — Discharge Instructions (Signed)
 The steroid shot should start working in the next 30 minutes to help reduce swelling and inflammation.  You can take 25 mg of Benadryl  every 6-8 hours to help with the localized allergic reaction.  Use the topical steroid cream twice daily, do not use this for longer than 7 days as it can cause skin thinning and skin discoloration.  Return to clinic for any new or urgent symptoms.

## 2024-07-25 NOTE — Progress Notes (Unsigned)
 No chief complaint on file.  History of Present Illness: 73 yo male with history of CAD s/p 3V CABG in 2012, HLD and carotid artery disease who is here today for cardiac follow up. He had been followed in our office by Dr. Maranda and then by Dr. Hobart. Cardiac cath in June 2023 with 3/3 patent bypass grafts (LIMA to LAD, RIMA to OM, SVG to PDA) with disease in the proximal PDA which limits flow into the posterolateral artery but unchanged and no stent was placed. Echo June 2023 with LVEF=60-65%. Normal RV function. No significant valve disease. Cardiac monitor in July 2023 with sinus, short runs of SVT. He is known to have carotid artery disease. Carotid dopplers June 2024 with severe RICA stenosis, moderate LICA stenosis.   He is here today for follow up. The patient denies any chest pain, dyspnea, palpitations, lower extremity edema, orthopnea, PND, dizziness, near syncope or syncope.   Primary Care Physician: Patient, No Pcp Per   Past Medical History:  Diagnosis Date   CAD (coronary artery disease)    October, 2012,, catheterization done, CABG   Carotid artery disease (HCC)    Doppler, October, 2012, 60-79% R. ICA   Dyslipidemia    Ejection fraction     EF 65%, catheterization, October, 2012   Fatigue    Hx of CABG    October, 2012, Dr. Kerrin.    Shortness of breath    October, 2012, This was an anginal equivalent    Past Surgical History:  Procedure Laterality Date   CORONARY ARTERY BYPASS GRAFT  09/2011   LEFT HEART CATH AND CORS/GRAFTS ANGIOGRAPHY N/A 06/04/2022   Procedure: LEFT HEART CATH AND CORS/GRAFTS ANGIOGRAPHY;  Surgeon: Dann Candyce RAMAN, MD;  Location: [facility] INVASIVE CV LAB;  Service: Cardiovascular;  Laterality: N/A;   TONSILLECTOMY      Current Outpatient Medications  Medication Sig Dispense Refill   aspirin  EC 81 MG tablet Take 81 mg by mouth every evening.     clopidogrel  (PLAVIX ) 75 MG tablet Take 1 tablet (75 mg total) by mouth daily. 30 tablet  6   diphenhydrAMINE  (BENADRYL ) 25 MG tablet Take 1 tablet (25 mg total) by mouth every 6 (six) hours as needed. 30 tablet 0   ezetimibe  (ZETIA ) 10 MG tablet Take 1 tablet (10 mg total) by mouth daily. 90 tablet 3   meloxicam (MOBIC) 15 MG tablet Take 15 mg by mouth daily.     rosuvastatin  (CRESTOR ) 40 MG tablet Take 1 tablet (40 mg total) by mouth daily. 90 tablet 3   triamcinolone  cream (KENALOG ) 0.1 % Apply 1 Application topically 2 (two) times daily. 30 g 0   No current facility-administered medications for this visit.    No Known Allergies  Social History   Socioeconomic History   Marital status: Married    Spouse name: Not on file   Number of children: 0   Years of education: Not on file   Highest education level: Not on file  Occupational History   Occupation: management @ [employer]  Tobacco Use   Smoking status: Former    Current packs/day: 0.00    Types: Cigarettes    Quit date: 12/20/1984    Years since quitting: 39.6   Smokeless tobacco: Never  Substance and Sexual Activity   Alcohol use: Yes    Comment: 2-3 a year maybe   Drug use: No   Sexual activity: Not on file  Other Topics Concern   Not on file  Social History Narrative   Not on file   Social Drivers of Health   Financial Resource Strain: Not on file  Food Insecurity: Not on file  Transportation Needs: Not on file  Physical Activity: Not on file  Stress: Not on file  Social Connections: Not on file  Intimate Partner Violence: Not on file    Family History  Problem Relation Age of Onset   Heart attack Father 20   Hyperlipidemia Brother    Stroke Mother    Thyroid disease Mother    Dementia Mother    Coronary artery disease Unknown    Hypertension Unknown     Review of Systems:  As stated in the HPI and otherwise negative.   There were no vitals taken for this visit.  Physical Examination: General: Well developed, well nourished, NAD  HEENT: OP clear, mucus membranes moist  SKIN: warm,  dry. No rashes. Neuro: No focal deficits  Musculoskeletal: Muscle strength 5/5 all ext  Psychiatric: Mood and affect normal  Neck: No JVD, no carotid bruits, no thyromegaly, no lymphadenopathy.  Lungs:Clear bilaterally, no wheezes, rhonci, crackles Cardiovascular: Regular rate and rhythm. No murmurs, gallops or rubs. Abdomen:Soft. Bowel sounds present. Non-tender.  Extremities: No lower extremity edema. Pulses are 2 + in the bilateral DP/PT.  EKG:  EKG is not *** ordered today. The ekg ordered today demonstrates   Recent Labs: No results found for requested labs within last 365 days.   Lipid Panel    Component Value Date/Time   CHOL 130 07/04/2023 0851   TRIG 78 07/04/2023 0851   HDL 34 (L) 07/04/2023 0851   CHOLHDL 3.8 07/04/2023 0851   CHOLHDL 4.9 12/10/2015 0929   VLDL 11 12/10/2015 0929   LDLCALC 80 07/04/2023 0851     Wt Readings from Last 3 Encounters:  06/21/24 179 lb (81.2 kg)  11/16/23 175 lb 12.8 oz (79.7 kg)  04/07/23 174 lb 9.6 oz (79.2 kg)    Assessment and Plan:   1. CAD s/p CABG without angina: No chest pain. Continue ASA, Zetia  and statin.    2. Carotid artery disease: Moderately severe RICA stenosis by dopplers in June 2025. He is now followed in the [facility] office by Dr. Lanis. CTA neck July 2025 with report of stable RICA disease compared to scan in 2023.   3. HTN: White coat HTN. BP is well controlled at home.   4. HLD: He is on Crestor  40 mg daily and Zetia  10 mg daily. LDL ***. He does not wish to consider Repatha or Praluent. Continue statin and Zetia .   Labs/ tests ordered today include:  No orders of the defined types were placed in this encounter.  Disposition:   F/U with me in one year    Signed, Lonni Cash, MD, [facility] 07/25/2024 3:13 PM    [facility] [address] [place], [place], [place]  [address] Phone: [phone]; Fax: [phone]

## 2024-07-26 ENCOUNTER — Encounter: Payer: Self-pay | Admitting: Cardiovascular Disease

## 2024-07-26 ENCOUNTER — Ambulatory Visit: Attending: Cardiovascular Disease | Admitting: Cardiovascular Disease

## 2024-07-26 VITALS — BP 133/68 | HR 72 | Resp 16 | Ht 67.0 in | Wt 175.6 lb

## 2024-07-26 DIAGNOSIS — I779 Disorder of arteries and arterioles, unspecified: Secondary | ICD-10-CM | POA: Diagnosis not present

## 2024-07-26 DIAGNOSIS — E782 Mixed hyperlipidemia: Secondary | ICD-10-CM | POA: Diagnosis not present

## 2024-07-26 DIAGNOSIS — Z01810 Encounter for preprocedural cardiovascular examination: Secondary | ICD-10-CM | POA: Diagnosis not present

## 2024-07-26 DIAGNOSIS — I2510 Atherosclerotic heart disease of native coronary artery without angina pectoris: Secondary | ICD-10-CM

## 2024-07-26 NOTE — Patient Instructions (Signed)
 Medication Instructions:  No changes *If you need a refill on your cardiac medications before your next appointment, please call your pharmacy*  Lab Work: none If you have labs (blood work) drawn today and your tests are completely normal, you will receive your results only by: MyChart Message (if you have MyChart) OR A paper copy in the mail If you have any lab test that is abnormal or we need to change your treatment, we will call you to review the results.  Testing/Procedures: none  Follow-Up: At [facility], you and your health needs are our priority.  As part of our continuing mission to provide you with exceptional heart care, our providers are all part of one team.  This team includes your primary Cardiologist (physician) and Advanced Practice Providers or APPs (Physician Assistants and Nurse Practitioners) who all work together to provide you with the care you need, when you need it.  Your next appointment:   6 month(s)  Provider:   Lonni Cash, MD

## 2024-07-31 NOTE — Progress Notes (Signed)
 Office Note     HPI: Scott Marshall is a 73 y.o. ([birth year]) male presenting in follow-up with bilateral carotid stenosis, right greater than left.  A native of [place], he graduated from [employer].  He is happily married, retired, and continues to live an active, independent lifestyle.  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  They are coming up on their 10-year reunion.  At his last visit, stenosis was found to be critical on the right.  He underwent CT angiogram to further define the lesion and cardiac clearance due to prior CABG in 2012.  On exam today, Clement was doing well, accompanied by his wife.  He has had no issues since his last visit.  Har[place]os denies history of TIA, stroke, amaurosis Medical history includes MI with CABG in 2012.  Former smoker, stopped in the late 80s.   Past Medical History:  Diagnosis Date   CAD (coronary artery disease)    October, 2012,, catheterization done, CABG   Carotid artery disease (HCC)    Doppler, October, 2012, 60-79% R. ICA   Dyslipidemia    Ejection fraction     EF 65%, catheterization, October, 2012   Fatigue    Hx of CABG    October, 2012, Dr. Kerrin.    Shortness of breath    October, 2012, This was an anginal equivalent    Past Surgical History:  Procedure Laterality Date   CORONARY ARTERY BYPASS GRAFT  09/2011   LEFT HEART CATH AND CORS/GRAFTS ANGIOGRAPHY N/A 06/04/2022   Procedure: LEFT HEART CATH AND CORS/GRAFTS ANGIOGRAPHY;  Surgeon: Dann Candyce RAMAN, MD;  Location: [facility];  Service: Cardiovascular;  Laterality: N/A;   TONSILLECTOMY      Social History   Socioeconomic History   Marital status: Married    Spouse name: Not on file   Number of children: 0   Years of education: Not on file   Highest education level: Not on file  Occupational History   Occupation: management @ [employer]  Tobacco Use    Smoking status: Former    Current packs/day: 0.00    Types: Cigarettes    Quit date: 12/20/1984    Years since quitting: 39.6   Smokeless tobacco: Never  Vaping Use   Vaping status: Never Used  Substance and Sexual Activity   Alcohol use: Yes    Comment: 2-3 a year maybe   Drug use: No   Sexual activity: Not on file  Other Topics Concern   Not on file  Social History Narrative   Not on file   Social Drivers of Health   Financial Resource Strain: Not on file  Food Insecurity: Not on file  Transportation Needs: Not on file  Physical Activity: Not on file  Stress: Not on file  Social Connections: Not on file  Intimate Partner Violence: Not on file   Family History  Problem Relation Age of Onset   Heart attack Father 11   Hyperlipidemia Brother    Stroke Mother    Thyroid disease Mother    Dementia Mother    Coronary artery disease Unknown    Hypertension Unknown     Current Outpatient Medications  Medication Sig Dispense Refill   aspirin  EC 81 MG tablet Take 81 mg by mouth every evening.     clopidogrel  (PLAVIX ) 75 MG tablet Take 1 tablet (75 mg total) by mouth daily.  30 tablet 6   ezetimibe  (ZETIA ) 10 MG tablet Take 1 tablet (10 mg total) by mouth daily. 90 tablet 3   meloxicam (MOBIC) 15 MG tablet Take 15 mg by mouth daily.     rosuvastatin  (CRESTOR ) 40 MG tablet Take 1 tablet (40 mg total) by mouth daily. 90 tablet 3   No current facility-administered medications for this visit.    No Known Allergies   REVIEW OF SYSTEMS:  [X]  denotes positive finding, [ ]  denotes negative finding Cardiac  Comments:  Chest pain or chest pressure:    Shortness of breath upon exertion:    Short of breath when lying flat:    Irregular heart rhythm:        Vascular    Pain in calf, thigh, or hip brought on by ambulation:    Pain in feet at night that wakes you up from your sleep:     Blood clot in your veins:    Leg swelling:         Pulmonary    Oxygen at home:     Productive cough:     Wheezing:         Neurologic    Sudden weakness in arms or legs:     Sudden numbness in arms or legs:     Sudden onset of difficulty speaking or slurred speech:    Temporary loss of vision in one eye:     Problems with dizziness:         Gastrointestinal    Blood in stool:     Vomited blood:         Genitourinary    Burning when urinating:     Blood in urine:        Psychiatric    Major depression:         Hematologic    Bleeding problems:    Problems with blood clotting too easily:        Skin    Rashes or ulcers:        Constitutional    Fever or chills:      PHYSICAL EXAMINATION:  There were no vitals filed for this visit.  General:  WDWN in NAD; vital signs documented above Gait: Not observed HENT: WNL, normocephalic Pulmonary: normal non-labored breathing , without wheezing Cardiac: regular HR Abdomen: soft, NT, no masses Skin: without rashes Vascular Exam/Pulses:  Right Left  Radial 2+ (normal) 2+ (normal)  Ulnar    Femoral    Popliteal    DP 2+ (normal) 2+ (normal)  PT     Extremities: without ischemic changes, without Gangrene , without cellulitis; without open wounds;  Musculoskeletal: no muscle wasting or atrophy  Neurologic: A&O X 3;  No focal weakness or paresthesias are detected Psychiatric:  The pt has Normal affect.    Non-Invasive Vascular Imaging:     Right Carotid Findings:  +----------+--------+--------+--------+------------------+---------+           PSV cm/sEDV cm/sStenosisPlaque DescriptionComments   +----------+--------+--------+--------+------------------+---------+  CCA Prox  96      20                                           +----------+--------+--------+--------+------------------+---------+  CCA Mid   59      18                                           +----------+--------+--------+--------+------------------+---------+  CCA Distal113     31              calcific                      +----------+--------+--------+--------+------------------+---------+  ICA Prox  387     102     80-99%  calcific          Shadowing  +----------+--------+--------+--------+------------------+---------+  ICA Mid   163     42                                           +----------+--------+--------+--------+------------------+---------+  ICA Distal48      14                                           +----------+--------+--------+--------+------------------+---------+  ECA      105     25                                           +----------+--------+--------+--------+------------------+---------+   +----------+--------+-------+----------------+-------------------+           PSV cm/sEDV cmsDescribe        Arm Pressure (mmHG)  +----------+--------+-------+----------------+-------------------+  [id number]    10     Multiphasic, WNL140                  +----------+--------+-------+----------------+-------------------+   +---------+--------+--+--------+--+---------+  VertebralPSV cm/s60EDV cm/s13Antegrade  +---------+--------+--+--------+--+---------+      Left Carotid Findings:  +----------+--------+--------+--------+------------------+---------+           PSV cm/sEDV cm/sStenosisPlaque DescriptionComments   +----------+--------+--------+--------+------------------+---------+  CCA Prox  126     29                                           +----------+--------+--------+--------+------------------+---------+  CCA Mid   105     24              heterogenous                 +----------+--------+--------+--------+------------------+---------+  CCA Distal103     33              calcific                     +----------+--------+--------+--------+------------------+---------+  ICA Prox  178     48      40-59%  calcific          Shadowing   +----------+--------+--------+--------+------------------+---------+  ICA Mid   125     33                                           +----------+--------+--------+--------+------------------+---------+  ICA Distal58      21                                           +----------+--------+--------+--------+------------------+---------+  ECA      334     45                                           +----------+--------+--------+--------+------------------+---------+   +----------+--------+--------+----------------+-------------------+           PSV cm/sEDV cm/sDescribe        Arm Pressure (mmHG)  +----------+--------+--------+----------------+-------------------+  Subclavian180    0       Multiphasic, [id number]                  +----------+--------+--------+----------------+-------------------+   +---------+--------+--------+---------+  VertebralPSV cm/sEDV cm/sAntegrade  +---------+--------+--------+---------+      ASSESSMENT/PLAN: JACQUELYN ANTONY is a 73 y.o. male presenting with bilateral asymptomatic carotid stenosis, right greater than left.  I had a long conversation with Gaither regarding the above.  I evaluated his CT angio head and neck which demonstrates a low bifurcation of the carotid with focal, critical ICA stenosis at the bifurcation.  We discussed that asymptomatic carotid artery stenosis carries an 11% risk of stroke over the next 5 years.  With intervention, this is lowered to 5%.  Hue is not a candidate for TCAR, but is a candidate for carotid endarterectomy.  After discussing the risks and benefits of carotid endarterectomy, Zed elected to proceed.    I asked that he continue his current medication regimen which includes dual antiplatelet therapy, high intensity statin.  This will be scheduled at his convenience.  We discussed the signs and symptoms of stroke.  I asked him to call 911 immediately should any of these occur.   Fonda FORBES Rim, MD [facility] [phone]

## 2024-08-02 ENCOUNTER — Other Ambulatory Visit: Payer: Self-pay | Admitting: *Deleted

## 2024-08-02 ENCOUNTER — Encounter: Payer: Self-pay | Admitting: Vascular Surgery

## 2024-08-02 ENCOUNTER — Ambulatory Visit: Attending: Vascular Surgery | Admitting: Vascular Surgery

## 2024-08-02 VITALS — BP 135/78 | HR 63 | Temp 98.1°F | Resp 16 | Ht 67.0 in | Wt 176.3 lb

## 2024-08-02 DIAGNOSIS — I6523 Occlusion and stenosis of bilateral carotid arteries: Secondary | ICD-10-CM

## 2024-08-03 ENCOUNTER — Encounter: Payer: Self-pay | Admitting: Vascular Surgery

## 2024-08-21 ENCOUNTER — Encounter (HOSPITAL_COMMUNITY): Payer: Self-pay

## 2024-08-21 NOTE — Progress Notes (Signed)
 Surgical Instructions   Your procedure is scheduled on Monday, September 8th, 2025. Report to [facility] Main Entrance A at 5:30 A.M., then check in with the Admitting office. Any questions or running late day of surgery: call [phone]  Questions prior to your surgery date: call [phone], Monday-Friday, 8am-4pm. If you experience any cold or flu symptoms such as cough, fever, chills, shortness of breath, etc. between now and your scheduled surgery, please notify us  at the above number.     Remember:  Do not eat or drink after midnight the night before your surgery    Take these medicines the morning of surgery with A SIP OF WATER: Aspirin  Clopidogrel  (Plavix ) Ezetimibe  (Zetia ) Rosuvastatin  (Crestor )   May take these medicines IF NEEDED: None.    One week prior to surgery, STOP taking any Aspirin  (unless otherwise instructed by your surgeon) Aleve, Naproxen, Ibuprofen, Motrin, Advil, Goody's, BC's, all herbal medications, fish oil, and non-prescription vitamins.  This includes Meloxicam (Mobic).                      Do NOT Smoke (Tobacco/Vaping) for 24 hours prior to your procedure.  If you use a CPAP at night, you may bring your mask/headgear for your overnight stay.   You will be asked to remove any contacts, glasses, piercing's, hearing aid's, dentures/partials prior to surgery. Please bring cases for these items if needed.    Patients discharged the day of surgery will not be allowed to drive home, and someone needs to stay with them for 24 hours.  SURGICAL WAITING ROOM VISITATION Patients may have no more than 2 support people in the waiting area - these visitors may rotate.   Pre-op nurse will coordinate an appropriate time for 1 ADULT support person, who may not rotate, to accompany patient in pre-op.  Children under the age of 45 must have an adult with them who is not the patient and must remain in the main waiting area with an adult.  If the patient needs  to stay at the hospital during part of their recovery, the visitor guidelines for inpatient rooms apply.  Please refer to the [facility] website for the visitor guidelines for any additional information.   If you received a COVID test during your pre-op visit  it is requested that you wear a mask when out in public, stay away from anyone that may not be feeling well and notify your surgeon if you develop symptoms. If you have been in contact with anyone that has tested positive in the last 10 days please notify you surgeon.      Pre-operative CHG Bathing Instructions   You can play a key role in reducing the risk of infection after surgery. Your skin needs to be as free of germs as possible. You can reduce the number of germs on your skin by washing with CHG (chlorhexidine gluconate) soap before surgery. CHG is an antiseptic soap that kills germs and continues to kill germs even after washing.   DO NOT use if you have an allergy to chlorhexidine/CHG or antibacterial soaps. If your skin becomes reddened or irritated, stop using the CHG and notify one of our RNs at [phone].              TAKE A SHOWER THE NIGHT BEFORE SURGERY AND THE DAY OF SURGERY    Please keep in mind the following:  DO NOT shave, including legs and underarms, 48 hours prior to surgery.  You may shave your face before/day of surgery.  Place clean sheets on your bed the night before surgery Use a clean washcloth (not used since being washed) for each shower. DO NOT sleep with pet's night before surgery.  CHG Shower Instructions:  Wash your face and private area with normal soap. If you choose to wash your hair, wash first with your normal shampoo.  After you use shampoo/soap, rinse your hair and body thoroughly to remove shampoo/soap residue.  Turn the water OFF and apply half the bottle of CHG soap to a CLEAN washcloth.  Apply CHG soap ONLY FROM YOUR NECK DOWN TO YOUR TOES (washing for 3-5 minutes)  DO NOT use  CHG soap on face, private areas, open wounds, or sores.  Pay special attention to the area where your surgery is being performed.  If you are having back surgery, having someone wash your back for you may be helpful. Wait 2 minutes after CHG soap is applied, then you may rinse off the CHG soap.  Pat dry with a clean towel  Put on clean pajamas    Additional instructions for the day of surgery: DO NOT APPLY any lotions, deodorants, cologne, or perfumes.   Do not wear jewelry or makeup Do not wear nail polish, gel polish, artificial nails, or any other type of covering on natural nails (fingers and toes) Do not bring valuables to the hospital. [facility] is not responsible for valuables/personal belongings. Put on clean/comfortable clothes.  Please brush your teeth.  Ask your nurse before applying any prescription medications to the skin.

## 2024-08-21 NOTE — Progress Notes (Signed)
 PCP - none Cardiologist - Dr Lonni Cash (clearance 07/26/24)  Chest x-ray - n/a EKG - 07/26/24 Stress Test - 2011 ECHO - 06/16/22 Cardiac Cath - 06/04/22  ICD Pacemaker/Loop - n/a  Sleep Study -  n/a  Diabetes - n/a  Aspirin  & Plavix  Instructions:  Continue per MD  NPO  Anesthesia review: Yes  STOP now taking any Aspirin  (unless otherwise instructed by your surgeon), Aleve, Naproxen, Ibuprofen, Mobic, Motrin, Advil, Goody's, BC's, all herbal medications, fish oil, and all vitamins.   Coronavirus Screening Do you have any of the following symptoms:  Cough yes/no: No Fever (>100.21F)  yes/no: No Runny nose yes/no: No Sore throat yes/no: No Difficulty breathing/shortness of breath  yes/no: No  Have you traveled in the last 14 days and where? yes/no: No  Patient verbalized understanding of instructions that were given to them at the PAT appointment. Patient was also instructed that they will need to review over the PAT instructions again at home before surgery.

## 2024-08-22 ENCOUNTER — Encounter (HOSPITAL_COMMUNITY)
Admission: RE | Admit: 2024-08-22 | Discharge: 2024-08-22 | Disposition: A | Discharge status: Home / Self Care | Source: Ambulatory Visit | Attending: Vascular Surgery | Admitting: Vascular Surgery

## 2024-08-22 ENCOUNTER — Encounter (HOSPITAL_COMMUNITY): Payer: Self-pay

## 2024-08-22 ENCOUNTER — Other Ambulatory Visit: Payer: Self-pay

## 2024-08-22 VITALS — BP 139/74 | HR 61 | Temp 98.8°F | Resp 17 | Ht 67.0 in | Wt 174.4 lb

## 2024-08-22 DIAGNOSIS — Z951 Presence of aortocoronary bypass graft: Secondary | ICD-10-CM | POA: Diagnosis not present

## 2024-08-22 DIAGNOSIS — Z01818 Encounter for other preprocedural examination: Secondary | ICD-10-CM | POA: Diagnosis present

## 2024-08-22 DIAGNOSIS — Z01812 Encounter for preprocedural laboratory examination: Secondary | ICD-10-CM | POA: Diagnosis not present

## 2024-08-22 DIAGNOSIS — Z7902 Long term (current) use of antithrombotics/antiplatelets: Secondary | ICD-10-CM | POA: Diagnosis not present

## 2024-08-22 DIAGNOSIS — E785 Hyperlipidemia, unspecified: Secondary | ICD-10-CM | POA: Diagnosis not present

## 2024-08-22 DIAGNOSIS — I6523 Occlusion and stenosis of bilateral carotid arteries: Secondary | ICD-10-CM | POA: Insufficient documentation

## 2024-08-22 DIAGNOSIS — I2510 Atherosclerotic heart disease of native coronary artery without angina pectoris: Secondary | ICD-10-CM | POA: Insufficient documentation

## 2024-08-22 HISTORY — DX: Essential (primary) hypertension: I10

## 2024-08-22 HISTORY — DX: Unspecified osteoarthritis, unspecified site: M19.90

## 2024-08-22 LAB — URINALYSIS, ROUTINE W REFLEX MICROSCOPIC
Bilirubin Urine: NEGATIVE
Glucose, UA: NEGATIVE mg/dL
Ketones, ur: NEGATIVE mg/dL
Leukocytes,Ua: NEGATIVE
Nitrite: NEGATIVE
Protein, ur: NEGATIVE mg/dL
Specific Gravity, Urine: 1.019 (ref 1.005–1.030)
pH: 5 (ref 5.0–8.0)

## 2024-08-22 LAB — COMPREHENSIVE METABOLIC PANEL WITH GFR
ALT: 43 U/L (ref 0–44)
AST: 41 U/L (ref 15–41)
Albumin: 4.1 g/dL (ref 3.5–5.0)
Alkaline Phosphatase: 49 U/L (ref 38–126)
Anion gap: 6 (ref 5–15)
BUN: 17 mg/dL (ref 8–23)
CO2: 26 mmol/L (ref 22–32)
Calcium: 9.4 mg/dL (ref 8.9–10.3)
Chloride: 106 mmol/L (ref 98–111)
Creatinine, Ser: 1.12 mg/dL (ref 0.61–1.24)
GFR, Estimated: >60 mL/min (ref >60)
Glucose, Bld: 95 mg/dL (ref 70–99)
Potassium: 4 mmol/L (ref 3.5–5.1)
Sodium: 138 mmol/L (ref 135–145)
Total Bilirubin: 1 mg/dL (ref 0.0–1.2)
Total Protein: 6.9 g/dL (ref 6.5–8.1)

## 2024-08-22 LAB — CBC
HCT: 46.2 % (ref 39.0–52.0)
Hemoglobin: 15.5 g/dL (ref 13.0–17.0)
MCH: 32 pg (ref 26.0–34.0)
MCHC: 33.5 g/dL (ref 30.0–36.0)
MCV: 95.5 fL (ref 80.0–100.0)
Platelets: 141 K/uL — ABNORMAL LOW (ref 150–400)
RBC: 4.84 MIL/uL (ref 4.22–5.81)
RDW: 13.3 % (ref 11.5–15.5)
WBC: 6.1 K/uL (ref 4.0–10.5)
nRBC: 0 % (ref 0.0–0.2)

## 2024-08-22 LAB — SURGICAL PCR SCREEN
MRSA, PCR: NEGATIVE
Staphylococcus aureus: NEGATIVE

## 2024-08-22 LAB — TYPE AND SCREEN
ABO/RH(D): A NEG
Antibody Screen: NEGATIVE

## 2024-08-22 LAB — APTT: aPTT: 31 s (ref 24–36)

## 2024-08-22 NOTE — Progress Notes (Signed)
 Surgical Instructions   Your procedure is scheduled on Monday, September 8th, 2025. Report to [facility] Main Entrance A at 5:30 A.M., then check in with the Admitting office. Any questions or running late day of surgery: call [phone]  Questions prior to your surgery date: call [phone], Monday-Friday, 8am-4pm. If you experience any cold or flu symptoms such as cough, fever, chills, shortness of breath, etc. between now and your scheduled surgery, please notify us  at the above number.     Remember:  Do not eat or drink after midnight the night before your surgery    Take these medicines the morning of surgery with A SIP OF WATER: None   May take these medicines IF NEEDED: Tylenol     One week prior to surgery, STOP taking any Aspirin  (unless otherwise instructed by your surgeon) Aleve, Naproxen, Ibuprofen, Motrin, Advil, Goody's, BC's, all herbal medications, fish oil, and non-prescription vitamins.  This includes Meloxicam (Mobic).                      Do NOT Smoke (Tobacco/Vaping) for 24 hours prior to your procedure.  If you use a CPAP at night, you may bring your mask/headgear for your overnight stay.   You will be asked to remove any contacts, glasses, piercing's, hearing aid's, dentures/partials prior to surgery. Please bring cases for these items if needed.    Patients discharged the day of surgery will not be allowed to drive home, and someone needs to stay with them for 24 hours.  SURGICAL WAITING ROOM VISITATION Patients may have no more than 2 support people in the waiting area - these visitors may rotate.   Pre-op nurse will coordinate an appropriate time for 1 ADULT support person, who may not rotate, to accompany patient in pre-op.  Children under the age of 51 must have an adult with them who is not the patient and must remain in the main waiting area with an adult.  If the patient needs to stay at the hospital during part of their recovery, the visitor  guidelines for inpatient rooms apply.  Please refer to the [facility] website for the visitor guidelines for any additional information.   If you received a COVID test during your pre-op visit  it is requested that you wear a mask when out in public, stay away from anyone that may not be feeling well and notify your surgeon if you develop symptoms. If you have been in contact with anyone that has tested positive in the last 10 days please notify you surgeon.      Pre-operative CHG Bathing Instructions   You can play a key role in reducing the risk of infection after surgery. Your skin needs to be as free of germs as possible. You can reduce the number of germs on your skin by washing with CHG (chlorhexidine gluconate) soap before surgery. CHG is an antiseptic soap that kills germs and continues to kill germs even after washing.   DO NOT use if you have an allergy to chlorhexidine/CHG or antibacterial soaps. If your skin becomes reddened or irritated, stop using the CHG and notify one of our RNs at [phone].              TAKE A SHOWER THE NIGHT BEFORE SURGERY AND THE DAY OF SURGERY    Please keep in mind the following:  DO NOT shave, including legs and underarms, 48 hours prior to surgery.   You may shave your face  before/day of surgery.  Place clean sheets on your bed the night before surgery Use a clean washcloth (not used since being washed) for each shower. DO NOT sleep with pet's night before surgery.  CHG Shower Instructions:  Wash your face and private area with normal soap. If you choose to wash your hair, wash first with your normal shampoo.  After you use shampoo/soap, rinse your hair and body thoroughly to remove shampoo/soap residue.  Turn the water OFF and apply half the bottle of CHG soap to a CLEAN washcloth.  Apply CHG soap ONLY FROM YOUR NECK DOWN TO YOUR TOES (washing for 3-5 minutes)  DO NOT use CHG soap on face, private areas, open wounds, or sores.  Pay special  attention to the area where your surgery is being performed.  If you are having back surgery, having someone wash your back for you may be helpful. Wait 2 minutes after CHG soap is applied, then you may rinse off the CHG soap.  Pat dry with a clean towel  Put on clean pajamas    Additional instructions for the day of surgery: DO NOT APPLY any lotions, deodorants, cologne, or perfumes.   Do not wear jewelry or makeup Do not wear nail polish, gel polish, artificial nails, or any other type of covering on natural nails (fingers and toes) Do not bring valuables to the hospital. [facility] is not responsible for valuables/personal belongings. Put on clean/comfortable clothes.  Please brush your teeth.  Ask your nurse before applying any prescription medications to the skin.

## 2024-08-23 ENCOUNTER — Telehealth: Payer: Self-pay

## 2024-08-23 NOTE — Telephone Encounter (Signed)
 Per JER, patient's platelet count is low.  Directions to stop plavix  now and remain off until JER gives further instruction.  Carotid endarterectomy scheduled for 9/8.

## 2024-08-23 NOTE — Progress Notes (Signed)
 Anesthesia Chart Review:  73 year old male follows with cardiology for history of CAD s/p three-vessel CABG 2012, HLD, carotid artery disease. Cardiac cath in June 2023 with 3/3 patent bypass grafts (LIMA to LAD, RIMA to OM, SVG to PDA) with disease in the proximal PDA which limits flow into the posterolateral artery but unchanged and no stent was placed. Echo June 2023 with LVEF=60-65%. Normal RV function. No significant valve disease. Cardiac monitor in July 2023 with sinus, short runs of SVT. He is known to have carotid artery disease. Carotid dopplers June 2025 with severe RICA stenosis, moderate LICA stenosis.  He was seen by [name] on 07/26/2024 for preop evaluation.  Per note, He is very active. He has no active cardiac complaints. EKG is normal. No further cardiac testing is indicated at this time.  Preop labs reviewed, mild thrombocytopenia with platelets 141.  Vascular surgery aware, instructed patient to hold Plavix  as of 08/23/2024.  EKG 07/26/2024: NSR.  Rate 61.  TTE 06/16/2022: 1. Left ventricular ejection fraction, by estimation, is 60 to 65%. The  left ventricle has normal function. The left ventricle has no regional  wall motion abnormalities. Left ventricular diastolic parameters were  normal.   2. Right ventricular systolic function is normal. The right ventricular  size is normal. Tricuspid regurgitation signal is inadequate for assessing  PA pressure.   3. The mitral valve is normal in structure. Trivial mitral valve  regurgitation. No evidence of mitral stenosis.   4. The aortic valve is tricuspid. Aortic valve regurgitation is not  visualized. Aortic valve sclerosis/calcification is present, without any  evidence of aortic stenosis.   5. The inferior vena cava is normal in size with greater than 50%  respiratory variability, suggesting right atrial pressure of 3 mmHg.   Cath 06/04/2022:   Mid LM to Dist LM lesion is 99% stenosed.   Ost Cx to Prox Cx lesion is 90%  stenosed.  RIMA to OM is patent.   Ost LAD to Prox LAD lesion is 99% stenosed.  LIMA to LAD is patent.   Prox RCA to Mid RCA lesion is 100% stenosed.  SVG to PDA is patent.   RPDA lesion is 90% stenosed.   The left ventricular systolic function is normal.   LV end diastolic pressure is mildly elevated.   The left ventricular ejection fraction is 55-65% by visual estimate.   There is no aortic valve stenosis.   Patent LIMA, RIMA and SVG grafts.  There is proximal PDA disease which may limit flow into the posterolateral artery.  I reviewed the films from 2012.  This is unchanged.  Therefore, no clear target for PCI.  Continue medical therapy and investigation of other causes of lightheadedness.      Lynwood Geofm RIGGERS [facility] Short Stay Center/Anesthesiology Phone [phone] 08/23/2024 3:22 PM

## 2024-08-23 NOTE — Anesthesia Preprocedure Evaluation (Addendum)
 Anesthesia Evaluation  Patient identified by MRN, date of birth, ID band Patient awake    Reviewed: Allergy & Precautions, NPO status , Patient's Chart, lab work & pertinent test results  Airway Mallampati: III  TM Distance: >3 FB Neck ROM: Full    Dental no notable dental hx.    Pulmonary former smoker   Pulmonary exam normal        Cardiovascular hypertension, + CAD and + CABG  Normal cardiovascular exam     Neuro/Psych negative neurological ROS  negative psych ROS   GI/Hepatic negative GI ROS, Neg liver ROS,,,  Endo/Other  negative endocrine ROS    Renal/GU negative Renal ROS     Musculoskeletal   Abdominal   Peds  Hematology  (+) Blood dyscrasia (Plavix )   Anesthesia Other Findings Bilateral carotid artery occlusion  Reproductive/Obstetrics                              Anesthesia Physical Anesthesia Plan  ASA: 4  Anesthesia Plan: General   Post-op Pain Management:    Induction: Intravenous  PONV Risk Score and Plan: 2 and Ondansetron , Dexamethasone  and Treatment may vary due to age or medical condition  Airway Management Planned: Oral ETT  Additional Equipment: Arterial line  Intra-op Plan:   Post-operative Plan: Extubation in OR  Informed Consent: I have reviewed the patients History and Physical, chart, labs and discussed the procedure including the risks, benefits and alternatives for the proposed anesthesia with the patient or authorized representative who has indicated his/her understanding and acceptance.     Dental advisory given  Plan Discussed with: CRNA  Anesthesia Plan Comments: (PAT note by Scott Marshall, Scott Marshall: 74 year old male follows with cardiology for history of CAD s/p three-vessel CABG 2012, HLD, carotid artery disease. Cardiac cath in June 2023 with 3/3 patent bypass grafts (LIMA to LAD, RIMA to OM, SVG to PDA) with disease in the proximal PDA which  limits flow into the posterolateral artery but unchanged and no stent was placed. Echo June 2023 with LVEF=60-65%. Normal RV function. No significant valve disease. Cardiac monitor in July 2023 with sinus, short runs of SVT. He is known to have carotid artery disease. Carotid dopplers June 2025 with severe RICA stenosis, moderate LICA stenosis.  He was seen by [name] on 07/26/2024 for preop evaluation.  Per note, He is very active. He has no active cardiac complaints. EKG is normal. No further cardiac testing is indicated at this time.  Preop labs reviewed, mild thrombocytopenia with platelets 141.  Vascular surgery aware, instructed patient to hold Plavix  as of 08/23/2024.  EKG 07/26/2024: NSR.  Rate 61.  TTE 06/16/2022: 1. Left ventricular ejection fraction, by estimation, is 60 to 65%. The  left ventricle has normal function. The left ventricle has no regional  wall motion abnormalities. Left ventricular diastolic parameters were  normal.   2. Right ventricular systolic function is normal. The right ventricular  size is normal. Tricuspid regurgitation signal is inadequate for assessing  PA pressure.   3. The mitral valve is normal in structure. Trivial mitral valve  regurgitation. No evidence of mitral stenosis.   4. The aortic valve is tricuspid. Aortic valve regurgitation is not  visualized. Aortic valve sclerosis/calcification is present, without any  evidence of aortic stenosis.   5. The inferior vena cava is normal in size with greater than 50%  respiratory variability, suggesting right atrial pressure of 3 mmHg.   Cath  06/04/2022:   Mid LM to Dist LM lesion is 99% stenosed.   Ost Cx to Prox Cx lesion is 90% stenosed.  RIMA to OM is patent.   Ost LAD to Prox LAD lesion is 99% stenosed.  LIMA to LAD is patent.   Prox RCA to Mid RCA lesion is 100% stenosed.  SVG to PDA is patent.   RPDA lesion is 90% stenosed.   The left ventricular systolic function is normal.   LV end  diastolic pressure is mildly elevated.   The left ventricular ejection fraction is 55-65% by visual estimate.   There is no aortic valve stenosis.   Patent LIMA, RIMA and SVG grafts.  There is proximal PDA disease which may limit flow into the posterolateral artery.  I reviewed the films from 2012.  This is unchanged.  Therefore, no clear target for PCI.  Continue medical therapy and investigation of other causes of lightheadedness.   )         Anesthesia Quick Evaluation

## 2024-08-27 ENCOUNTER — Inpatient Hospital Stay (HOSPITAL_COMMUNITY): Payer: Self-pay | Admitting: Physician Assistant

## 2024-08-27 ENCOUNTER — Other Ambulatory Visit: Payer: Self-pay

## 2024-08-27 ENCOUNTER — Encounter (HOSPITAL_COMMUNITY)
Admission: RE | Disposition: A | Discharge status: Home / Self Care | Payer: Self-pay | Source: Home / Self Care | Attending: Vascular Surgery

## 2024-08-27 ENCOUNTER — Inpatient Hospital Stay (HOSPITAL_COMMUNITY): Admitting: Certified Registered Nurse Anesthetist

## 2024-08-27 ENCOUNTER — Encounter (HOSPITAL_COMMUNITY): Payer: Self-pay | Admitting: Vascular Surgery

## 2024-08-27 ENCOUNTER — Inpatient Hospital Stay (HOSPITAL_COMMUNITY)
Admission: RE | Admit: 2024-08-27 | Discharge: 2024-08-28 | DRG: 039 | Disposition: A | Discharge status: Home / Self Care | Attending: Vascular Surgery | Admitting: Vascular Surgery

## 2024-08-27 DIAGNOSIS — Z87891 Personal history of nicotine dependence: Secondary | ICD-10-CM | POA: Diagnosis not present

## 2024-08-27 DIAGNOSIS — E785 Hyperlipidemia, unspecified: Secondary | ICD-10-CM | POA: Diagnosis present

## 2024-08-27 DIAGNOSIS — I6523 Occlusion and stenosis of bilateral carotid arteries: Secondary | ICD-10-CM | POA: Diagnosis not present

## 2024-08-27 DIAGNOSIS — Z823 Family history of stroke: Secondary | ICD-10-CM

## 2024-08-27 DIAGNOSIS — Z8249 Family history of ischemic heart disease and other diseases of the circulatory system: Secondary | ICD-10-CM

## 2024-08-27 DIAGNOSIS — Z23 Encounter for immunization: Secondary | ICD-10-CM

## 2024-08-27 DIAGNOSIS — I10 Essential (primary) hypertension: Secondary | ICD-10-CM | POA: Diagnosis present

## 2024-08-27 DIAGNOSIS — Z91030 Bee allergy status: Secondary | ICD-10-CM | POA: Diagnosis not present

## 2024-08-27 DIAGNOSIS — Z9889 Other specified postprocedural states: Principal | ICD-10-CM

## 2024-08-27 DIAGNOSIS — Z8349 Family history of other endocrine, nutritional and metabolic diseases: Secondary | ICD-10-CM | POA: Diagnosis not present

## 2024-08-27 DIAGNOSIS — Z83438 Family history of other disorder of lipoprotein metabolism and other lipidemia: Secondary | ICD-10-CM | POA: Diagnosis not present

## 2024-08-27 DIAGNOSIS — I2510 Atherosclerotic heart disease of native coronary artery without angina pectoris: Secondary | ICD-10-CM | POA: Diagnosis present

## 2024-08-27 DIAGNOSIS — I6521 Occlusion and stenosis of right carotid artery: Secondary | ICD-10-CM | POA: Diagnosis present

## 2024-08-27 DIAGNOSIS — I6529 Occlusion and stenosis of unspecified carotid artery: Secondary | ICD-10-CM | POA: Diagnosis present

## 2024-08-27 DIAGNOSIS — Z951 Presence of aortocoronary bypass graft: Secondary | ICD-10-CM

## 2024-08-27 HISTORY — PX: ENDARTERECTOMY: SHX5162

## 2024-08-27 HISTORY — PX: PATCH ANGIOPLASTY: SHX6230

## 2024-08-27 LAB — POCT ACTIVATED CLOTTING TIME
Activated Clotting Time: 256 s
Activated Clotting Time: 285 s

## 2024-08-27 SURGERY — ENDARTERECTOMY, CAROTID
Anesthesia: General | Site: Neck | Laterality: Right

## 2024-08-27 MED ORDER — CEFAZOLIN SODIUM-DEXTROSE 2-4 GM/100ML-% IV SOLN
2.0000 g | INTRAVENOUS | Status: AC
  Administered 2024-08-27: 2 g via INTRAVENOUS

## 2024-08-27 MED ORDER — SODIUM CHLORIDE 0.9 % IV SOLN
0.1500 ug/kg/min | INTRAVENOUS | Status: DC
  Administered 2024-08-27: .15 ug/kg/min via INTRAVENOUS
  Filled 2024-08-27 (×3): qty 2000

## 2024-08-27 MED ORDER — PROPOFOL 10 MG/ML IV BOLUS
INTRAVENOUS | Status: AC
  Filled 2024-08-27: qty 20

## 2024-08-27 MED ORDER — PHENYLEPHRINE 80 MCG/ML (10ML) SYRINGE FOR IV PUSH (FOR BLOOD PRESSURE SUPPORT)
PREFILLED_SYRINGE | INTRAVENOUS | Status: AC
  Filled 2024-08-27: qty 10

## 2024-08-27 MED ORDER — HEPARIN 6000 UNIT IRRIGATION SOLUTION
Status: AC
Start: 2024-08-27 — End: 2024-08-27
  Filled 2024-08-27: qty 500

## 2024-08-27 MED ORDER — CEFAZOLIN SODIUM-DEXTROSE 2-4 GM/100ML-% IV SOLN
INTRAVENOUS | Status: AC
  Filled 2024-08-27: qty 100

## 2024-08-27 MED ORDER — ESMOLOL HCL 100 MG/10ML IV SOLN
INTRAVENOUS | Status: DC | PRN
  Administered 2024-08-27: 20 mg via INTRAVENOUS

## 2024-08-27 MED ORDER — DEXAMETHASONE SODIUM PHOSPHATE 10 MG/ML IJ SOLN
INTRAMUSCULAR | Status: DC | PRN
  Administered 2024-08-27: 5 mg via INTRAVENOUS

## 2024-08-27 MED ORDER — LIDOCAINE 2% (20 MG/ML) 5 ML SYRINGE
INTRAMUSCULAR | Status: AC
  Filled 2024-08-27: qty 5

## 2024-08-27 MED ORDER — PROPOFOL 10 MG/ML IV BOLUS
INTRAVENOUS | Status: DC | PRN
  Administered 2024-08-27: 200 mg via INTRAVENOUS

## 2024-08-27 MED ORDER — ONDANSETRON HCL 4 MG/2ML IJ SOLN: 4.0000 mg | Freq: Once | INTRAMUSCULAR | Status: DC | PRN

## 2024-08-27 MED ORDER — STERILE WATER FOR IRRIGATION IR SOLN
Status: DC | PRN
  Administered 2024-08-27: 1000 mL

## 2024-08-27 MED ORDER — DEXAMETHASONE SODIUM PHOSPHATE 10 MG/ML IJ SOLN
INTRAMUSCULAR | Status: AC
  Filled 2024-08-27: qty 1

## 2024-08-27 MED ORDER — DEXMEDETOMIDINE HCL IN NACL 80 MCG/20ML IV SOLN
INTRAVENOUS | Status: AC
  Filled 2024-08-27: qty 20

## 2024-08-27 MED ORDER — SURGIFLO WITH THROMBIN (HEMOSTATIC MATRIX KIT) OPTIME
TOPICAL | Status: DC | PRN
  Administered 2024-08-27: 1 via TOPICAL

## 2024-08-27 MED ORDER — ALBUMIN HUMAN 5 % IV SOLN
INTRAVENOUS | Status: AC
  Filled 2024-08-27: qty 250

## 2024-08-27 MED ORDER — PSEUDOEPHEDRINE HCL 30 MG PO TABS
30.0000 mg | ORAL_TABLET | Freq: Four times a day (QID) | ORAL | Status: DC
  Administered 2024-08-27 – 2024-08-28 (×4): 30 mg via ORAL
  Filled 2024-08-27 (×7): qty 1

## 2024-08-27 MED ORDER — LIDOCAINE 2% (20 MG/ML) 5 ML SYRINGE
INTRAMUSCULAR | Status: DC | PRN
  Administered 2024-08-27: 60 mg via INTRAVENOUS

## 2024-08-27 MED ORDER — PHENYLEPHRINE 80 MCG/ML (10ML) SYRINGE FOR IV PUSH (FOR BLOOD PRESSURE SUPPORT)
PREFILLED_SYRINGE | INTRAVENOUS | Status: DC | PRN
  Administered 2024-08-27 (×2): 80 ug via INTRAVENOUS

## 2024-08-27 MED ORDER — ACETAMINOPHEN 325 MG PO TABS: 325.0000 mg | ORAL_TABLET | ORAL | Status: DC | PRN

## 2024-08-27 MED ORDER — PROTAMINE SULFATE 10 MG/ML IV SOLN
INTRAVENOUS | Status: DC | PRN
Start: 2024-08-27 — End: 2024-08-27
  Administered 2024-08-27: 40 mg via INTRAVENOUS
  Administered 2024-08-27: 10 mg via INTRAVENOUS

## 2024-08-27 MED ORDER — ASPIRIN 81 MG PO TBEC
81.0000 mg | DELAYED_RELEASE_TABLET | Freq: Every day | ORAL | Status: DC
  Administered 2024-08-27: 81 mg via ORAL
  Filled 2024-08-27: qty 1

## 2024-08-27 MED ORDER — LIDOCAINE-EPINEPHRINE (PF) 1 %-1:200000 IJ SOLN
INTRAMUSCULAR | Status: AC
  Filled 2024-08-27: qty 30

## 2024-08-27 MED ORDER — SODIUM CHLORIDE 0.9 % IV SOLN: INTRAVENOUS | Status: DC

## 2024-08-27 MED ORDER — HYDRALAZINE HCL 20 MG/ML IJ SOLN: 5.0000 mg | INTRAMUSCULAR | Status: DC | PRN

## 2024-08-27 MED ORDER — ROCURONIUM BROMIDE 10 MG/ML (PF) SYRINGE
PREFILLED_SYRINGE | INTRAVENOUS | Status: DC | PRN
  Administered 2024-08-27: 60 mg via INTRAVENOUS
  Administered 2024-08-27: 10 mg via INTRAVENOUS

## 2024-08-27 MED ORDER — ALBUMIN HUMAN 5 % IV SOLN
12.5000 g | Freq: Once | INTRAVENOUS | Status: AC
  Administered 2024-08-27: 12.5 g via INTRAVENOUS

## 2024-08-27 MED ORDER — HEMOSTATIC AGENTS (NO CHARGE) OPTIME
TOPICAL | Status: DC | PRN
  Administered 2024-08-27: 1 via TOPICAL

## 2024-08-27 MED ORDER — ACETAMINOPHEN 10 MG/ML IV SOLN: 1000.0000 mg | Freq: Once | INTRAVENOUS | Status: DC | PRN

## 2024-08-27 MED ORDER — CEFAZOLIN SODIUM-DEXTROSE 2-4 GM/100ML-% IV SOLN
2.0000 g | Freq: Three times a day (TID) | INTRAVENOUS | Status: AC
  Administered 2024-08-27 – 2024-08-28 (×2): 2 g via INTRAVENOUS
  Filled 2024-08-27 (×2): qty 100

## 2024-08-27 MED ORDER — ONDANSETRON HCL 4 MG/2ML IJ SOLN
INTRAMUSCULAR | Status: AC
  Filled 2024-08-27: qty 2

## 2024-08-27 MED ORDER — METOPROLOL TARTRATE 5 MG/5ML IV SOLN: 2.5000 mg | INTRAVENOUS | Status: DC | PRN

## 2024-08-27 MED ORDER — CHLORHEXIDINE GLUCONATE CLOTH 2 % EX PADS: 6.0000 | MEDICATED_PAD | Freq: Once | CUTANEOUS | Status: DC

## 2024-08-27 MED ORDER — ONDANSETRON HCL 4 MG/2ML IJ SOLN
INTRAMUSCULAR | Status: DC | PRN
  Administered 2024-08-27: 4 mg via INTRAVENOUS

## 2024-08-27 MED ORDER — ROCURONIUM BROMIDE 10 MG/ML (PF) SYRINGE
PREFILLED_SYRINGE | INTRAVENOUS | Status: AC
  Filled 2024-08-27: qty 10

## 2024-08-27 MED ORDER — SODIUM CHLORIDE 0.9 % IV SOLN: 500.0000 mL | Freq: Once | INTRAVENOUS | Status: DC | PRN

## 2024-08-27 MED ORDER — LIDOCAINE HCL 1 % IJ SOLN
INTRAMUSCULAR | Status: AC
  Filled 2024-08-27: qty 20

## 2024-08-27 MED ORDER — PHENYLEPHRINE HCL-NACL 20-0.9 MG/250ML-% IV SOLN
INTRAVENOUS | Status: DC | PRN
  Administered 2024-08-27: 30 ug/min via INTRAVENOUS

## 2024-08-27 MED ORDER — DOCUSATE SODIUM 100 MG PO CAPS
100.0000 mg | ORAL_CAPSULE | Freq: Every day | ORAL | Status: DC
Start: 2024-08-28 — End: 2024-08-28

## 2024-08-27 MED ORDER — ACETAMINOPHEN 325 MG RE SUPP: 325.0000 mg | RECTAL | Status: DC | PRN

## 2024-08-27 MED ORDER — AMISULPRIDE (ANTIEMETIC) 5 MG/2ML IV SOLN: 10.0000 mg | Freq: Once | INTRAVENOUS | Status: DC | PRN

## 2024-08-27 MED ORDER — GLYCOPYRROLATE PF 0.2 MG/ML IJ SOSY
PREFILLED_SYRINGE | INTRAMUSCULAR | Status: DC | PRN
  Administered 2024-08-27: .2 mg via INTRAVENOUS

## 2024-08-27 MED ORDER — GLYCOPYRROLATE PF 0.2 MG/ML IJ SOSY
PREFILLED_SYRINGE | INTRAMUSCULAR | Status: AC
  Filled 2024-08-27: qty 1

## 2024-08-27 MED ORDER — CHLORHEXIDINE GLUCONATE 0.12 % MT SOLN: 15.0000 mL | OROMUCOSAL | Status: AC

## 2024-08-27 MED ORDER — SODIUM CHLORIDE 0.9 % IV SOLN: 250.0000 mL | INTRAVENOUS | Status: DC

## 2024-08-27 MED ORDER — PNEUMOCOCCAL 20-VAL CONJ VACC 0.5 ML IM SUSY
0.5000 mL | PREFILLED_SYRINGE | INTRAMUSCULAR | Status: DC
  Filled 2024-08-27: qty 0.5

## 2024-08-27 MED ORDER — ORAL CARE MOUTH RINSE: 15.0000 mL | OROMUCOSAL | Status: DC | PRN

## 2024-08-27 MED ORDER — PHENOL 1.4 % MT LIQD: 1.0000 | OROMUCOSAL | Status: DC | PRN

## 2024-08-27 MED ORDER — LACTATED RINGERS IV SOLN: INTRAVENOUS | Status: DC | PRN

## 2024-08-27 MED ORDER — OXYCODONE-ACETAMINOPHEN 5-325 MG PO TABS: 1.0000 | ORAL_TABLET | ORAL | Status: DC | PRN

## 2024-08-27 MED ORDER — HEPARIN SODIUM (PORCINE) 1000 UNIT/ML IJ SOLN
INTRAMUSCULAR | Status: AC
  Filled 2024-08-27: qty 10

## 2024-08-27 MED ORDER — POTASSIUM CHLORIDE CRYS ER 20 MEQ PO TBCR: 40.0000 meq | EXTENDED_RELEASE_TABLET | Freq: Every day | ORAL | Status: DC | PRN

## 2024-08-27 MED ORDER — HEPARIN SODIUM (PORCINE) 1000 UNIT/ML IJ SOLN
INTRAMUSCULAR | Status: DC | PRN
  Administered 2024-08-27: 1000 [IU] via INTRAVENOUS
  Administered 2024-08-27: 2000 [IU] via INTRAVENOUS
  Administered 2024-08-27: 8000 [IU] via INTRAVENOUS

## 2024-08-27 MED ORDER — DEXMEDETOMIDINE HCL IN NACL 80 MCG/20ML IV SOLN
INTRAVENOUS | Status: DC | PRN
  Administered 2024-08-27: 8 ug via INTRAVENOUS

## 2024-08-27 MED ORDER — 0.9 % SODIUM CHLORIDE (POUR BTL) OPTIME
TOPICAL | Status: DC | PRN
Start: 2024-08-27 — End: 2024-08-27
  Administered 2024-08-27: 2000 mL

## 2024-08-27 MED ORDER — FENTANYL CITRATE (PF) 250 MCG/5ML IJ SOLN
INTRAMUSCULAR | Status: AC
  Filled 2024-08-27: qty 5

## 2024-08-27 MED ORDER — ONDANSETRON HCL 4 MG/2ML IJ SOLN: 4.0000 mg | Freq: Four times a day (QID) | INTRAMUSCULAR | Status: DC | PRN

## 2024-08-27 MED ORDER — PROPOFOL 10 MG/ML IV BOLUS
INTRAVENOUS | Status: AC
Start: 2024-08-27 — End: 2024-08-27
  Filled 2024-08-27: qty 20

## 2024-08-27 MED ORDER — SUCCINYLCHOLINE CHLORIDE 200 MG/10ML IV SOSY
PREFILLED_SYRINGE | INTRAVENOUS | Status: AC
  Filled 2024-08-27: qty 10

## 2024-08-27 MED ORDER — SODIUM CHLORIDE 0.9 % IV SOLN
25.0000 ug/min | INTRAVENOUS | Status: DC
  Filled 2024-08-27: qty 2

## 2024-08-27 MED ORDER — LABETALOL HCL 5 MG/ML IV SOLN: 10.0000 mg | INTRAVENOUS | Status: DC | PRN

## 2024-08-27 MED ORDER — FENTANYL CITRATE (PF) 250 MCG/5ML IJ SOLN
INTRAMUSCULAR | Status: DC | PRN
  Administered 2024-08-27: 100 ug via INTRAVENOUS

## 2024-08-27 MED ORDER — ROSUVASTATIN CALCIUM 20 MG PO TABS
40.0000 mg | ORAL_TABLET | Freq: Every day | ORAL | Status: DC
  Administered 2024-08-28: 40 mg via ORAL
  Filled 2024-08-27: qty 2

## 2024-08-27 MED ORDER — CHLORHEXIDINE GLUCONATE 0.12 % MT SOLN
OROMUCOSAL | Status: AC
  Administered 2024-08-27: 15 mL via OROMUCOSAL
  Filled 2024-08-27: qty 15

## 2024-08-27 MED ORDER — EZETIMIBE 10 MG PO TABS
10.0000 mg | ORAL_TABLET | Freq: Every day | ORAL | Status: DC
  Administered 2024-08-28: 10 mg via ORAL
  Filled 2024-08-27: qty 1

## 2024-08-27 MED ORDER — FENTANYL CITRATE (PF) 100 MCG/2ML IJ SOLN: 25.0000 ug | INTRAMUSCULAR | Status: DC | PRN

## 2024-08-27 MED ORDER — INFLUENZA VAC SPLIT HIGH-DOSE 0.5 ML IM SUSY
0.5000 mL | PREFILLED_SYRINGE | INTRAMUSCULAR | Status: AC
  Administered 2024-08-28: 0.5 mL via INTRAMUSCULAR
  Filled 2024-08-27: qty 0.5

## 2024-08-27 MED ORDER — EPHEDRINE SULFATE-NACL 50-0.9 MG/10ML-% IV SOSY
PREFILLED_SYRINGE | INTRAVENOUS | Status: DC | PRN
  Administered 2024-08-27 (×2): 2.5 mg via INTRAVENOUS
  Administered 2024-08-27 (×2): 5 mg via INTRAVENOUS
  Administered 2024-08-27: 2.5 mg via INTRAVENOUS

## 2024-08-27 MED ORDER — ESMOLOL HCL 100 MG/10ML IV SOLN
INTRAVENOUS | Status: AC
  Filled 2024-08-27: qty 10

## 2024-08-27 MED ORDER — CLOPIDOGREL BISULFATE 75 MG PO TABS: 75.0000 mg | ORAL_TABLET | Freq: Every day | ORAL | Status: DC

## 2024-08-27 MED ORDER — PROTAMINE SULFATE 10 MG/ML IV SOLN
INTRAVENOUS | Status: AC
  Filled 2024-08-27: qty 5

## 2024-08-27 MED ORDER — DEXAMETHASONE SODIUM PHOSPHATE 10 MG/ML IJ SOLN
INTRAMUSCULAR | Status: AC
Start: 2024-08-27 — End: 2024-08-27
  Filled 2024-08-27: qty 1

## 2024-08-27 MED ORDER — POLYETHYLENE GLYCOL 3350 17 G PO PACK: 17.0000 g | PACK | Freq: Every day | ORAL | Status: DC | PRN

## 2024-08-27 MED ORDER — MORPHINE SULFATE (PF) 2 MG/ML IV SOLN: 2.0000 mg | INTRAVENOUS | Status: DC | PRN

## 2024-08-27 MED ORDER — SUGAMMADEX SODIUM 200 MG/2ML IV SOLN
INTRAVENOUS | Status: DC | PRN
  Administered 2024-08-27: 200 mg via INTRAVENOUS

## 2024-08-27 MED ORDER — HEPARIN 6000 UNIT IRRIGATION SOLUTION
Status: DC | PRN
  Administered 2024-08-27: 1

## 2024-08-27 SURGICAL SUPPLY — 36 items
BAG COUNTER SPONGE SURGICOUNT (BAG) ×2 IMPLANT
CANISTER SUCTION 3000ML PPV (SUCTIONS) ×2 IMPLANT
CATH ROBINSON RED A/P 18FR (CATHETERS) ×2 IMPLANT
CLIP TI MEDIUM 24 (CLIP) ×2 IMPLANT
CLIP TI WIDE RED SMALL 24 (CLIP) ×2 IMPLANT
COVER PROBE W GEL 5X96 (DRAPES) ×2 IMPLANT
COVER TRANSDUCER ULTRASND GEL (DISPOSABLE) ×2 IMPLANT
DERMABOND ADVANCED .7 DNX12 (GAUZE/BANDAGES/DRESSINGS) ×2 IMPLANT
ELECTRODE REM PT RTRN 9FT ADLT (ELECTROSURGICAL) ×2 IMPLANT
GLOVE BIOGEL PI IND STRL 8 (GLOVE) ×2 IMPLANT
GOWN STRL REUS W/ TWL LRG LVL3 (GOWN DISPOSABLE) ×4 IMPLANT
GOWN STRL REUS W/TWL 2XL LVL3 (GOWN DISPOSABLE) ×4 IMPLANT
HEMOSTAT SNOW SURGICEL 2X4 (HEMOSTASIS) IMPLANT
KIT BASIN OR (CUSTOM PROCEDURE TRAY) ×2 IMPLANT
KIT SHUNT ARGYLE CAROTID ART 6 (VASCULAR PRODUCTS) IMPLANT
KIT TURNOVER KIT B (KITS) ×2 IMPLANT
NDL HYPO 25GX1X1/2 BEV (NEEDLE) IMPLANT
NDL SPNL 20GX3.5 QUINCKE YW (NEEDLE) IMPLANT
NEEDLE HYPO 25GX1X1/2 BEV (NEEDLE) IMPLANT
NEEDLE SPNL 20GX3.5 QUINCKE YW (NEEDLE) IMPLANT
NS IRRIG 1000ML POUR BTL (IV SOLUTION) ×6 IMPLANT
PACK CAROTID (CUSTOM PROCEDURE TRAY) ×2 IMPLANT
PAD ARMBOARD POSITIONER FOAM (MISCELLANEOUS) ×4 IMPLANT
PATCH VASC XENOSURE 1X6 (Vascular Products) IMPLANT
POSITIONER HEAD DONUT 9IN (MISCELLANEOUS) ×2 IMPLANT
SET WALTER ACTIVATION W/DRAPE (SET/KITS/TRAYS/PACK) ×2 IMPLANT
SURGIFLO W/THROMBIN 8M KIT (HEMOSTASIS) IMPLANT
SUT MNCRL AB 4-0 PS2 18 (SUTURE) ×2 IMPLANT
SUT PROLENE 6 0 BV (SUTURE) ×2 IMPLANT
SUT VIC AB 2-0 CT1 TAPERPNT 27 (SUTURE) ×2 IMPLANT
SUT VIC AB 3-0 SH 27X BRD (SUTURE) ×2 IMPLANT
SYR 20CC LL (SYRINGE) ×4 IMPLANT
SYR CONTROL 10ML LL (SYRINGE) IMPLANT
TOWEL GREEN STERILE (TOWEL DISPOSABLE) ×2 IMPLANT
VASCULAR TIE MINI RED 18IN STL (MISCELLANEOUS) IMPLANT
WATER STERILE IRR 1000ML POUR (IV SOLUTION) ×2 IMPLANT

## 2024-08-27 NOTE — Anesthesia Procedure Notes (Signed)
 Arterial Line Insertion Start/End9/07/2024 7:00 AM Performed by: Evetta Delon BROCKS, CRNA, CRNA  Patient location: Pre-op. Preanesthetic checklist: patient identified, IV checked and risks and benefits discussed Lidocaine  1% used for infiltration Left, radial was placed Catheter size: 20 G Hand hygiene performed   Attempts: 2 Procedure performed without using ultrasound guided technique. Following insertion, dressing applied and Biopatch. Post procedure assessment: unchanged  Patient tolerated the procedure well with no immediate complications.

## 2024-08-27 NOTE — Transfer of Care (Signed)
 Immediate Anesthesia Transfer of Care Note  Patient: Scott Marshall  Procedure(s) Performed: ENDARTERECTOMY, CAROTID (Right: Neck) ANGIOPLASTY, USING PATCH GRAFT  Patient Location: PACU  Anesthesia Type:General  Level of Consciousness: awake and patient cooperative  Airway & Oxygen Therapy: Patient Spontanous Breathing and Patient connected to face mask oxygen  Post-op Assessment: Report given to RN, Post -op Vital signs reviewed and stable, and Patient moving all extremities X 4  Post vital signs: Reviewed and stable  Last Vitals:  Vitals Value Taken Time  BP 111/62 08/27/24 10:13  Temp    Pulse 72 08/27/24 10:17  Resp 17 08/27/24 10:17  SpO2 96 % 08/27/24 10:17  Vitals shown include unfiled device data.  Last Pain:  Vitals:   08/27/24 0558  TempSrc: Tympanic  PainSc: 0-No pain      Patients Stated Pain Goal: 0 (08/27/24 0558)  Complications: No notable events documented.

## 2024-08-27 NOTE — H&P (Signed)
 Office Note   Patient seen and examined in preop holding.  No complaints. No changes to medication history or physical exam since last seen in clinic. After discussing the risks and benefits of right carotid endarterectomy for asymptomatic carotid artery stenosis, Scott Marshall elected to proceed.   Fonda FORBES Rim Scott Marshall   HPI: Scott Marshall is a 73 y.o. (04-Aug-1951) male presenting in follow-up with bilateral carotid stenosis, right greater than left.  A native of [place], he graduated from [employer].  He is happily married, retired, and continues to live an active, independent lifestyle.  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  They are coming up on their 10-year reunion.  At his last visit, stenosis was found to be critical on the right.  He underwent CT angiogram to further define the lesion and cardiac clearance due to prior CABG in 2012.  On exam today, Scott Marshall was doing well, accompanied by his wife.  He has had no issues since his last visit.  Scott Marshall denies history of TIA, stroke, amaurosis Medical history includes MI with CABG in 2012.  Former smoker, stopped in the late 80s.   Past Medical History:  Diagnosis Date   Arthritis    left knee   CAD (coronary artery disease)    October, 2012,, catheterization done, CABG   Carotid artery disease (HCC)    Doppler, October, 2012, 60-79% R. ICA   Dyslipidemia    Ejection fraction     EF 65%, catheterization, October, 2012   Fatigue    Hx of CABG    October, 2012, Dr. Kerrin.    Hyperlipidemia    Hypertension    hx , no current meds   Shortness of breath    Hx -October, 2012, This was an anginal equivalent; No current problems per pt 08/22/24    Past Surgical History:  Procedure Laterality Date   COLONOSCOPY     CORONARY ARTERY BYPASS GRAFT  09/20/2011   LEFT HEART CATH AND CORS/GRAFTS ANGIOGRAPHY N/A 06/04/2022    Procedure: LEFT HEART CATH AND CORS/GRAFTS ANGIOGRAPHY;  Surgeon: Scott Candyce RAMAN, Scott Marshall;  Location: [facility];  Service: Cardiovascular;  Laterality: N/A;   MOUTH SURGERY     x several w/extractions and bone grafts w/implants   TONSILLECTOMY     WISDOM TOOTH EXTRACTION      Social History   Socioeconomic History   Marital status: Married    Spouse name: Not on file   Number of children: 0   Years of education: Not on file   Highest education level: Not on file  Occupational History   Occupation: management @ [employer]  Tobacco Use   Smoking status: Former    Current packs/day: 0.00    Types: Cigarettes    Quit date: 12/20/1984    Years since quitting: 39.7   Smokeless tobacco: Never  Vaping Use   Vaping status: Never Used  Substance and Sexual Activity   Alcohol use: Yes    Comment: Rarely - maybe 2-3 drinks/year.   Drug use: No   Sexual activity: Yes  Other Topics Concern   Not on file  Social History Narrative   Not on file   Social Drivers of Health   Financial Resource Strain: Not on file  Food Insecurity: Not on file  Transportation Needs: Not on file  Physical Activity: Not on file  Stress: Not on  file  Social Connections: Not on file  Intimate Partner Violence: Not on file   Family History  Problem Relation Age of Onset   Heart attack Father 64   Hyperlipidemia Brother    Stroke Mother    Thyroid disease Mother    Dementia Mother    Coronary artery disease Unknown    Hypertension Unknown     Current Facility-Administered Medications  Medication Dose Route Frequency Provider Last Rate Last Admin   0.9 %  sodium chloride  infusion   Intravenous Continuous Scott Fonda BRAVO, Scott Marshall       ceFAZolin  (ANCEF ) 2-4 GM/100ML-% IVPB            ceFAZolin  (ANCEF ) IVPB 2g/100 mL premix  2 g Intravenous 30 min Pre-Op Scott Fonda BRAVO, Scott Marshall       Chlorhexidine  Gluconate Cloth 2 % PADS 6 each  6 each Topical Once Scott Swicegood Marshall, Scott Marshall       And   Chlorhexidine   Gluconate Cloth 2 % PADS 6 each  6 each Topical Once Scott Rafanan Marshall, Scott Marshall       remifentanil  (ULTIVA ) 2 mg in 100 mL normal saline (20 mcg/mL) Optime  0.15 mcg/kg/min (Ideal) Intravenous Continuous Scott Delon BROCKS, CRNA       Facility-Administered Medications Ordered in Other Encounters  Medication Dose Route Frequency Provider Last Rate Last Admin   lactated ringers  infusion   Intravenous Continuous PRN Scott Delon BROCKS, CRNA   New Bag at 08/27/24 0645   lactated ringers  infusion   Intravenous Continuous PRN Scott Delon BROCKS, CRNA   New Bag at 08/27/24 0705    Allergies  Allergen Reactions   Bee Venom Swelling     REVIEW OF SYSTEMS:  [X]  denotes positive finding, [ ]  denotes negative finding Cardiac  Comments:  Chest pain or chest pressure:    Shortness of breath upon exertion:    Short of breath when lying flat:    Irregular heart rhythm:        Vascular    Pain in calf, thigh, or hip brought on by ambulation:    Pain in feet at night that wakes you up from your sleep:     Blood clot in your veins:    Leg swelling:         Pulmonary    Oxygen at home:    Productive cough:     Wheezing:         Neurologic    Sudden weakness in arms or legs:     Sudden numbness in arms or legs:     Sudden onset of difficulty speaking or slurred speech:    Temporary loss of vision in one eye:     Problems with dizziness:         Gastrointestinal    Blood in stool:     Vomited blood:         Genitourinary    Burning when urinating:     Blood in urine:        Psychiatric    Major depression:         Hematologic    Bleeding problems:    Problems with blood clotting too easily:        Skin    Rashes or ulcers:        Constitutional    Fever or chills:      PHYSICAL EXAMINATION:  Vitals:   08/27/24 0558  BP: (!) 164/88  Pulse: 67  Resp: 18  Temp: 98.2 F (36.8 C)  TempSrc: Tympanic  SpO2: 96%  Weight: 78.9 kg  Height: 5' 7 (1.702 m)    General:   WDWN in NAD; vital signs documented above Gait: Not observed HENT: WNL, normocephalic Pulmonary: normal non-labored breathing , without wheezing Cardiac: regular HR Abdomen: soft, NT, no masses Skin: without rashes Vascular Exam/Pulses:  Right Left  Radial 2+ (normal) 2+ (normal)  Ulnar    Femoral    Popliteal    DP 2+ (normal) 2+ (normal)  PT     Extremities: without ischemic changes, without Gangrene , without cellulitis; without open wounds;  Musculoskeletal: no muscle wasting or atrophy  Neurologic: A&O X 3;  No focal weakness or paresthesias are detected Psychiatric:  The pt has Normal affect.    Non-Invasive Vascular Imaging:     Right Carotid Findings:  +----------+--------+--------+--------+------------------+---------+           PSV cm/sEDV cm/sStenosisPlaque DescriptionComments   +----------+--------+--------+--------+------------------+---------+  CCA Prox  96      20                                           +----------+--------+--------+--------+------------------+---------+  CCA Mid   59      18                                           +----------+--------+--------+--------+------------------+---------+  CCA Distal113     31              calcific                     +----------+--------+--------+--------+------------------+---------+  ICA Prox  387     102     80-99%  calcific          Shadowing  +----------+--------+--------+--------+------------------+---------+  ICA Mid   163     42                                           +----------+--------+--------+--------+------------------+---------+  ICA Distal48      14                                           +----------+--------+--------+--------+------------------+---------+  ECA      105     25                                           +----------+--------+--------+--------+------------------+---------+    +----------+--------+-------+----------------+-------------------+           PSV cm/sEDV cmsDescribe        Arm Pressure (mmHG)  +----------+--------+-------+----------------+-------------------+  [id number]    10     Multiphasic, WNL140                  +----------+--------+-------+----------------+-------------------+   +---------+--------+--+--------+--+---------+  VertebralPSV cm/s60EDV cm/s13Antegrade  +---------+--------+--+--------+--+---------+      Left Carotid Findings:  +----------+--------+--------+--------+------------------+---------+           PSV cm/sEDV cm/sStenosisPlaque DescriptionComments   +----------+--------+--------+--------+------------------+---------+  CCA Prox  126     29                                           +----------+--------+--------+--------+------------------+---------+  CCA Mid   105     24              heterogenous                 +----------+--------+--------+--------+------------------+---------+  CCA Distal103     33              calcific                     +----------+--------+--------+--------+------------------+---------+  ICA Prox  178     48      40-59%  calcific          Shadowing  +----------+--------+--------+--------+------------------+---------+  ICA Mid   125     33                                           +----------+--------+--------+--------+------------------+---------+  ICA Distal58      21                                           +----------+--------+--------+--------+------------------+---------+  ECA      334     45                                           +----------+--------+--------+--------+------------------+---------+   +----------+--------+--------+----------------+-------------------+           PSV cm/sEDV cm/sDescribe        Arm Pressure (mmHG)  +----------+--------+--------+----------------+-------------------+   Subclavian180    0       Multiphasic, [id number]                  +----------+--------+--------+----------------+-------------------+   +---------+--------+--------+---------+  VertebralPSV cm/sEDV cm/sAntegrade  +---------+--------+--------+---------+      ASSESSMENT/PLAN: WILLOW RECZEK is a 73 y.o. male presenting with bilateral asymptomatic carotid stenosis, right greater than left.  I had a long conversation with Scott regarding the above.  I evaluated his CT angio head and neck which demonstrates a low bifurcation of the carotid with focal, critical ICA stenosis at the bifurcation.  We discussed that asymptomatic carotid artery stenosis carries an 11% risk of stroke over the next 5 years.  With intervention, this is lowered to 5%.  Rico is not a candidate for TCAR, but is a candidate for carotid endarterectomy.  After discussing the risks and benefits of carotid endarterectomy, Sagar elected to proceed.    I asked that he continue his current medication regimen which includes dual antiplatelet therapy, high intensity statin.  This will be scheduled at his convenience.  We discussed the signs and symptoms of stroke.  I asked him to call 911 immediately should any of these occur.   Fonda FORBES Rim, Scott Marshall [facility] [phone]

## 2024-08-27 NOTE — Op Note (Signed)
 NAME: Scott Marshall    MRN: [id number] DOB: 10/02/1951    DATE OF OPERATION: 08/27/2024  PREOP DIAGNOSIS:    Asymptomatic critical right sided ICA stenosis  POSTOP DIAGNOSIS:    Same  PROCEDURE:    Right-sided carotid endarterectomy with bovine pericardial patch plasty  SURGEON: Fonda FORBES Rim  ASSIST: Donnice Sender, PA  ANESTHESIA: General  EBL: 30ml  INDICATIONS:    Scott Marshall is a 73 y.o. male whom I follow in clinic for bilateral carotid artery stenosis, right greater than left.  At his last visit, the right carotid artery was found to be greater than 80% stenosed.  After discussing the risks and benefits of right sided carotid endarterectomy to limit future risk of stroke, Mariah elected to proceed.  FINDINGS:   Calcific atherosclerotic plaque creating greater than 90% stenosis, and continuing into the internal carotid artery  TECHNIQUE:   After full informed written consent was obtained from the patient, the patient was brought back to the operating room and placed supine upon the operating table.  Prior to induction, the patient received IV antibiotics.  After obtaining adequate anesthesia, the patient was placed into semi-Fowler position with a shoulder roll in place and the patient's neck slightly hyperextended and rotated away from the surgical site.  The patient was prepped in the standard fashion for a left carotid endarterectomy.     I made an incision anterior to the sternocleidomastoid muscle and dissected down through the subcutaneous tissue.  The platysmas was opened with electrocautery.  Then I dissected down to the internal jugular vein.  This was dissected posteriorly until I obtained visualization of the common carotid artery.  This was dissected out and then an umbilical tape was placed around the common carotid artery and I loosely applied a Rumel tourniquet.  I then dissected in a periadventitial fashion along the common carotid artery up to the  bifurcation.  I then identified the external carotid artery and the superior thyroid artery.  A 2-0 silk tie was looped around the superior thyroid artery, and I also dissected out the external carotid artery and placed a vessel loop around it.  In continuing the dissection to the internal carotid artery, I identified the facial vein.  This was ligated and then transected, giving me improved exposure of the internal carotid artery.  In the process of this dissection, the hypoglossal nerve was identified.  The vagus nerve was also identified.  I then dissected out the internal carotid artery until I identified an area of soft tissue in the internal carotid artery.  I dissected slightly distal to this area, and placed a vessel loop.     At this point, I gave the patient a therapeutic bolus of Heparin  intravenously (roughly 100 units/kg).  To an ACT greater than 250.  After waiting 3 minutes, then I clamped the internal carotid artery, external carotid artery and then the common carotid artery.  I then made an arteriotomy in the common carotid artery with a 11 blade, and extended the arteriotomy with a Potts scissor down into the common carotid artery, then I carried the arteriotomy through the bifurcation into the internal carotid artery until I reached an area that was not diseased.  I elected not to shunt as there was excellent, pulsatile backbleeding.  At this point, I started the endarterectomy in the common carotid artery with a Cytogeneticist and carried this dissection down into the common carotid artery circumferentially.  Then I transected the  plaque at a segment where it was adherent.  I then carried this dissection up into the external carotid artery.  The plaque was extracted by unclamping the external carotid artery and everting the artery.  The dissection was then carried into the internal carotid artery, extracting the remaining portion of the carotid plaque.  I passed the plaque off the field as  a specimen.  I then spent the next 30 minutes removing intimal flaps and loose debris. Eventually I reached the point where the residual plaque was densely adherent and any further dissection would compromise the integrity of the wall.  After verifying that there was no more loose intimal flaps or debris, I re-interrogated the entirety of this carotid artery.  At this point, I was satisfied that the minimal remaining disease was densely adherent to the wall and wall integrity was intact.  At this point, I then fashioned a bovine pericardial patch for the geometry of this artery and sewed it in place with a running stitch of 6-0 Prolene suture.     Prior to completing this patch angioplasty, arteries were independently back bled beginning with the internal carotid artery, external carotid artery, common carotid artery.  Then I instilled heparinized saline in this patched artery and then completed the patch angioplasty in the usual fashion.  First, I released the clamp on the external carotid artery, then I released it on the common carotid artery.  After waiting a few seconds, I then released it on the internal carotid artery.  I then interrogated this patient's arteries with the continuous Doppler.  The audible waveforms in each artery were consistent with the expected characteristics for each artery.  The Sonosite probe was then sterilely draped and used to interrogate the carotid artery in both longitudinal and transverse views.  At this point, I washed out the wound, and placed thrombin  throughout.  I also gave the patient 50 mg of protamine  to reverse his anticoagulation.   After waiting a few minutes, I removed the thrombin  product and washed out the wound.  There was no more active bleeding in the surgical site.  I then reapproximated the platysma muscle with a running stitch of 2-0 Vicryl.  Below this, Surgiflo was instilled. The skin was then reapproximated with a running subcuticular 4-0 Monocryl stitch.   The skin was then cleaned, dried and Dermabond was used to reinforce the skin closure.  The patient woke without any problems, neurologically intact.        Given the complexity of the case,  the assistant was necessary in order to expedient the procedure and safely perform the technical aspects of the operation.  The assistant provided traction and countertraction to assist with exposure of the common carotid artery, external carotid artery, and internal carotid artery.  They also assisted with suture ligatures and dividing the facial vein and multiple small venous branches tethering the hypoglossal nerve. The assistant also played a critical role in placing the shunt safely.  In addition they were necessary to provide adequate traction and countertraction to perform a precise endarterectomy and precise closure.  These skills, especially following the Prolene suture for the anastomosis, could not have been adequately performed by a scrub tech assistant.      Fonda FORBES Rim, MD [facility] DATE OF DICTATION:   08/27/2024

## 2024-08-27 NOTE — Anesthesia Procedure Notes (Signed)
 Procedure Name: Intubation Date/Time: 08/27/2024 7:46 AM  Performed by: Evetta Delon BROCKS, CRNAPre-anesthesia Checklist: Patient identified, Emergency Drugs available, Suction available and Patient being monitored Patient Re-evaluated:Patient Re-evaluated prior to induction Oxygen Delivery Method: Circle system utilized Preoxygenation: Pre-oxygenation with 100% oxygen Induction Type: IV induction Ventilation: Mask ventilation without difficulty and Oral airway inserted - appropriate to patient size Laryngoscope Size: Mac and 4 Grade View: Grade III Tube type: Oral Tube size: 7.5 mm Number of attempts: 1 Airway Equipment and Method: Stylet and Oral airway Placement Confirmation: ETT inserted through vocal cords under direct vision, positive ETCO2 and breath sounds checked- equal and bilateral Secured at: 23 cm Tube secured with: Tape Dental Injury: Teeth and Oropharynx as per pre-operative assessment

## 2024-08-28 ENCOUNTER — Encounter (HOSPITAL_COMMUNITY): Payer: Self-pay | Admitting: Vascular Surgery

## 2024-08-28 LAB — BASIC METABOLIC PANEL WITH GFR
Anion gap: 11 (ref 5–15)
BUN: 20 mg/dL (ref 8–23)
CO2: 21 mmol/L — ABNORMAL LOW (ref 22–32)
Calcium: 8.7 mg/dL — ABNORMAL LOW (ref 8.9–10.3)
Chloride: 106 mmol/L (ref 98–111)
Creatinine, Ser: 1.2 mg/dL (ref 0.61–1.24)
GFR, Estimated: >60 mL/min (ref >60)
Glucose, Bld: 116 mg/dL — ABNORMAL HIGH (ref 70–99)
Potassium: 4.2 mmol/L (ref 3.5–5.1)
Sodium: 138 mmol/L (ref 135–145)

## 2024-08-28 LAB — CBC
HCT: 37.1 % — ABNORMAL LOW (ref 39.0–52.0)
Hemoglobin: 12.8 g/dL — ABNORMAL LOW (ref 13.0–17.0)
MCH: 31.9 pg (ref 26.0–34.0)
MCHC: 34.5 g/dL (ref 30.0–36.0)
MCV: 92.5 fL (ref 80.0–100.0)
Platelets: 118 K/uL — ABNORMAL LOW (ref 150–400)
RBC: 4.01 MIL/uL — ABNORMAL LOW (ref 4.22–5.81)
RDW: 13.2 % (ref 11.5–15.5)
WBC: 10.3 K/uL (ref 4.0–10.5)
nRBC: 0 % (ref 0.0–0.2)

## 2024-08-28 MED ORDER — OXYCODONE-ACETAMINOPHEN 5-325 MG PO TABS: 1.0000 | ORAL_TABLET | Freq: Four times a day (QID) | ORAL | 0 refills | Status: DC | PRN

## 2024-08-28 NOTE — Discharge Instructions (Signed)

## 2024-08-28 NOTE — Care Management Important Message (Signed)
 Important Message  Patient Details  Name: Scott Marshall MRN: [id number] Date of Birth: Jun 05, 1951   Important Message Given:  Yes - Medicare IM     [name] Sharps 08/28/2024, 8:59 AM

## 2024-08-28 NOTE — Progress Notes (Addendum)
  Progress Note    08/28/2024 7:06 AM 1 Day Post-Op  Subjective:  denies pain or difficulty swallowing.  Has been up walking to the bathroom several times overnight.    Afebrile HR 40's-60's  100's-110's systolic 100% RA  Gtts:  none  Vitals:   08/28/24 0546 08/28/24 0704  BP: (!) 102/55 118/62  Pulse: 69 (!) 50  Resp: 14 16  Temp: 98 F (36.7 C) 98.1 F (36.7 C)  SpO2: 97% 100%     Physical Exam: Neuro:  in tact; tongue is midline Lungs:  non labored Incision:  clean and dry  CBC    Component Value Date/Time   WBC 10.3 08/28/2024 0500   RBC 4.01 (L) 08/28/2024 0500   HGB 12.8 (L) 08/28/2024 0500   HGB 15.5 06/02/2022 1039   HCT 37.1 (L) 08/28/2024 0500   HCT 45.5 06/02/2022 1039   PLT 118 (L) 08/28/2024 0500   PLT 141 (L) 06/02/2022 1039   MCV 92.5 08/28/2024 0500   MCV 93 06/02/2022 1039   MCH 31.9 08/28/2024 0500   MCHC 34.5 08/28/2024 0500   RDW 13.2 08/28/2024 0500   RDW 13.5 06/02/2022 1039   LYMPHSABS 1.6 06/02/2022 1039   MONOABS 0.7 05/29/2022 1017   EOSABS 0.3 06/02/2022 1039   BASOSABS 0.0 06/02/2022 1039    BMET    Component Value Date/Time   NA 138 08/28/2024 0500   NA 139 10/04/2022 0854   K 4.2 08/28/2024 0500   CL 106 08/28/2024 0500   CO2 21 (L) 08/28/2024 0500   GLUCOSE 116 (H) 08/28/2024 0500   BUN 20 08/28/2024 0500   BUN 26 10/04/2022 0854   CREATININE 1.20 08/28/2024 0500   CREATININE 1.19 12/10/2015 0929   CALCIUM  8.7 (L) 08/28/2024 0500   GFRNONAA >60 08/28/2024 0500   GFRNONAA 76 05/29/2015 1555   GFRAA 68 06/20/2020 0910   GFRAA 88 05/29/2015 1555     Intake/Output Summary (Last 24 hours) at 08/28/2024 0706 Last data filed at 08/27/2024 1600 Gross per 24 hour  Intake 1874.66 ml  Output 450 ml  Net 1424.66 ml     Assessment/Plan:  This is a 73 y.o. male who is s/p right CEA for asymptomatic critical right sided ICA stenosis   1 Day Post-Op   -pt is doing well this am. -pt neuro exam is in tact -pt has  ambulated -pt has voided -continue asa/statin.  Will discontinue Plavix .  -ok to walk on treadmill at discharge and hold on any lifting.   -f/u with [facility] in 2-3 weeks on Dr. Lanis clinic day.  Discussed with pt that when we see him back, we will set him up for carotid duplex in 9 months.   -pain med sent to outside pharmacy - he does not need to pick it up if he does not need it.     Lucie Apt, PA-C [facility] [phone]  VASCULAR STAFF ADDENDUM: I have independently interviewed and examined the patient. I agree with the above.  Overall, patient looks good status post CEA.  Plan for discharge today.  Fonda FORBES Lanis MD [facility] Phone Number: [phone] 08/28/2024 8:00 AM

## 2024-08-28 NOTE — TOC Transition Note (Signed)
 Transition of Care (TOC) - Discharge Note Rayfield Gobble RN, BSN Inpatient Care Management Unit 4E- RN Case Manager See Treatment Team for direct phone #   Patient Details  Name: Scott Marshall MRN: [id number] Date of Birth: 11/03/1951  Transition of Care [facility]) CM/SW Contact:  Gobble Rayfield Hurst, RN Phone Number: 08/28/2024, 10:18 AM   Clinical Narrative:    Pt s/p CEA, stable for transition home today. Spouse to transport home. No HH or DME needs noted. Pt independent.  CM notified by [facility] liaison that [facility] office had made pre-op referral for [facility] needs- liaison updated that pt had no discharge HH needs.   No IP CM interventions needed for discharge.   Final next level of care: Home/Self Care Barriers to Discharge: No Barriers Identified   Patient Goals and CMS Choice Patient states their goals for this hospitalization and ongoing recovery are:: return home   Choice offered to / list presented to : NA      Discharge Placement               Home        Discharge Plan and Services Additional resources added to the After Visit Summary for   In-house Referral: NA Discharge Planning Services: NA Post Acute Care Choice: NA          DME Arranged: N/A DME Agency: NA       HH Arranged: NA HH Agency: NA        Social Drivers of Health (SDOH) Interventions SDOH Screenings   Food Insecurity: No Food Insecurity (08/27/2024)  Housing: Low Risk  (08/27/2024)  Transportation Needs: No Transportation Needs (08/27/2024)  Utilities: Not At Risk (08/27/2024)  Social Connections: Moderately Integrated (08/27/2024)  Tobacco Use: Medium Risk (08/27/2024)     Readmission Risk Interventions    08/28/2024   10:18 AM  Readmission Risk Prevention Plan  Post Dischage Appt Complete  Medication Screening Complete  Transportation Screening Complete

## 2024-08-28 NOTE — Discharge Summary (Signed)
 Discharge Summary     Scott Marshall [birth year] 73 y.o. male  [id number]  Admission Date: 08/27/2024  Discharge Date: 08/28/2024  Physician: Lanis Fonda BRAVO, MD  Admission Diagnosis: Bilateral carotid artery occlusion [I65.23] Status post carotid endarterectomy [Z98.890] Carotid artery stenosis [I65.29]   HPI:   This is a 73 y.o. male ***  Hospital Course:  The patient was admitted to the hospital and taken to the operating room on 08/27/2024 and underwent ***    Findings: ***  The pt tolerated the procedure well and was transported to the PACU in {DESC; GOOD/FAIR/POOR:18685} condition.   By POD 1, the pt neuro status ***   Recent Labs    08/28/24 0500  NA 138  K 4.2  CL 106  CO2 21*  GLUCOSE 116*  BUN 20  CALCIUM  8.7*   Recent Labs    08/28/24 0500  WBC 10.3  HGB 12.8*  HCT 37.1*  PLT 118*   No results for input(s): INR in the last 72 hours.     Discharge Diagnosis:  Bilateral carotid artery occlusion [I65.23] Status post carotid endarterectomy [Z98.890] Carotid artery stenosis [I65.29]  Secondary Diagnosis: Patient Active Problem List   Diagnosis Date Noted   Status post carotid endarterectomy 08/27/2024   Carotid artery stenosis 08/27/2024   Elevated TSH 02/02/2017   Hyperlipidemia 01/27/2017   Dyslipidemia    Ejection fraction    Carotid artery disease (HCC)    CAD (coronary artery disease)    Hx of CABG    Shortness of breath    Fatigue    Past Medical History:  Diagnosis Date   Arthritis    left knee   CAD (coronary artery disease)    October, 2012,, catheterization done, CABG   Carotid artery disease (HCC)    Doppler, October, 2012, 60-79% R. ICA   Dyslipidemia    Ejection fraction     EF 65%, catheterization, October, 2012   Fatigue    Hx of CABG    October, 2012, Dr. Kerrin.    Hyperlipidemia    Hypertension    hx , no current meds   Shortness of breath    Hx -October, 2012, This was an anginal equivalent; No  current problems per pt 08/22/24    Allergies as of 08/28/2024       Reactions   Bee Venom Swelling     Med Rec must be completed prior to using this [facility]***        [facility] Discharge Instructions Carotid Endarterectomy (CEA)  Please refer to the following instructions for your post-procedure care. Your surgeon or physician assistant will discuss any changes with you.  Activity  You are encouraged to walk as much as you can. You can slowly return to normal activities but must avoid strenuous activity and heavy lifting until your doctor tell you it's OK. Avoid activities such as vacuuming or swinging a golf club. You can drive after one week if you are comfortable and you are no longer taking prescription pain medications. It is normal to feel tired for serval weeks after your surgery. It is also normal to have difficulty with sleep habits, eating, and bowel movements after surgery. These will go away with time.  Bathing/Showering  You may shower after you come home. Do not soak in a bathtub, hot tub, or swim until the incision heals completely.  Incision Care  Shower every day. Clean your incision with mild soap and water . Pat the area  dry with a clean towel. You do not need a bandage unless otherwise instructed. Do not apply any ointments or creams to your incision. You may have skin glue on your incision. Do not peel it off. It will come off on its own in about one week. Your incision may feel thickened and raised for several weeks after your surgery. This is normal and the skin will soften over time. For Men Only: It's OK to shave around the incision but do not shave the incision itself for 2 weeks. It is common to have numbness under your chin that could last for several months.  Diet  Resume your normal diet. There are no special food restrictions following this procedure. A low fat/low cholesterol diet is recommended for all patients with  vascular disease. In order to heal from your surgery, it is CRITICAL to get adequate nutrition. Your body requires vitamins, minerals, and protein. Vegetables are the best source of vitamins and minerals. Vegetables also provide the perfect balance of protein. Processed food has little nutritional value, so try to avoid this.  Medications  Resume taking all of your medications unless your doctor or physician assistant tells you not to.  If your incision is causing pain, you may take over-the- counter pain relievers such as acetaminophen  (Tylenol ). If you were prescribed a stronger pain medication, please be aware these medications can cause nausea and constipation.  Prevent nausea by taking the medication with a snack or meal. Avoid constipation by drinking plenty of fluids and eating foods with a high amount of fiber, such as fruits, vegetables, and grains.  Do not take Tylenol  if you are taking prescription pain medications.  Follow Up  Our office will schedule a follow up appointment 2-3 weeks following discharge.  Please call us  immediately for any of the following conditions  Increased pain, redness, drainage (pus) from your incision site. Fever of 101 degrees or higher. If you should develop stroke (slurred speech, difficulty swallowing, weakness on one side of your body, loss of vision) you should call 911 and go to the nearest emergency room.  Reduce your risk of vascular disease:  Stop smoking. If you would like help call [facility] at 1-800-QUIT-NOW ((902)428-4520) or [facility] at (979) 360-3781. Manage your cholesterol Maintain a desired weight Control your diabetes Keep your blood pressure down  If you have any questions, please call the office at [phone].  Prescriptions given: 1.   Roxicet #8 No Refill  Disposition: home  Patient's condition: is Good  Follow up: 1. [facility] in 2-3 weeks on Dr. Lanis clinic day   Lucie Apt, PA-C [facility] [phone]   --- For [facility] Registry use ---   Modified Rankin score at D/C (0-6): 0  IV medication needed for:  1. Hypertension: No 2. Hypotension: No  Post-op Complications: No  1. Post-op CVA or TIA: No  If yes: Event classification (right eye, left eye, right cortical, left cortical, verterobasilar, other): n/a  If yes: Timing of event (intra-op, <6 hrs post-op, >=6 hrs post-op, unknown): n/a  2. CN injury: No  If yes: CN n/a injuried   3. Myocardial infarction: No  If yes: Dx by (EKG or clinical, Troponin): n/a  4.  CHF: No  5.  Dysrhythmia (new): No  6. Wound infection: No  7. Reperfusion symptoms: No  8. Return to OR: No  If yes: return to OR for (bleeding, neurologic, other CEA incision, other): n/a  Discharge medications: Statin use:  Yes ASA  use:  Yes   Beta blocker use:  No ACE-Inhibitor use:  No  ARB use:  No CCB use: No P2Y12 Antagonist use: No, [ ]  Plavix , [ ]  Plasugrel, [ ]  Ticlopinine, [ ]  Ticagrelor, [ ]  Other, [ ]  No for medical reason, [ ]  Non-compliant, [ ]  Not-indicated Anti-coagulant use:  No, [ ]  Warfarin, [ ]  Rivaroxaban, [ ]  Dabigatran,

## 2024-09-04 NOTE — Anesthesia Postprocedure Evaluation (Signed)
 Anesthesia Post Note  Patient: Scott Marshall  Procedure(s) Performed: ENDARTERECTOMY, CAROTID (Right: Neck) ANGIOPLASTY, USING PATCH GRAFT     Patient location during evaluation: PACU Anesthesia Type: General Level of consciousness: awake Pain management: pain level controlled Vital Signs Assessment: post-procedure vital signs reviewed and stable Respiratory status: spontaneous breathing, nonlabored ventilation and respiratory function stable Cardiovascular status: blood pressure returned to baseline and stable Postop Assessment: no apparent nausea or vomiting Anesthetic complications: no   No notable events documented.  Last Vitals:  Vitals:   08/28/24 0546 08/28/24 0704  BP: (!) 102/55 118/62  Pulse: 69 (!) 50  Resp: 14 16  Temp: 36.7 C 36.7 C  SpO2: 97% 100%    Last Pain:  Vitals:   08/28/24 0851  TempSrc:   PainSc: 0-No pain                 Alexanderjames Berg P Dijon Kohlman

## 2024-09-27 ENCOUNTER — Ambulatory Visit: Attending: Vascular Surgery | Admitting: Physician Assistant

## 2024-09-27 VITALS — BP 144/86 | HR 57 | Temp 97.7°F | Wt 176.9 lb

## 2024-09-27 DIAGNOSIS — I6523 Occlusion and stenosis of bilateral carotid arteries: Secondary | ICD-10-CM

## 2024-09-27 NOTE — Progress Notes (Signed)
  POST OPERATIVE OFFICE NOTE    CC:  F/u for surgery  HPI:  Scott Marshall is a 73 y.o. male who is here for a postop visit.  He recently underwent right carotid endarterectomy with bovine pericardial patch angioplasty by Dr.Robins on 08/27/2024.  This was done for asymptomatic high-grade right ICA stenosis.  He returns today for follow-up.  He has no complaints at today's office visit.  He denies any issues with his incision such as redness, drainage, or dehiscence.  He is tolerating a normal diet without swallowing difficulty.  He denies any strokelike symptoms.  He says he has been walking at least an hour every day.   Allergies  Allergen Reactions   Bee Venom Swelling    Current Outpatient Medications  Medication Sig Dispense Refill   aspirin  EC 81 MG tablet Take 81 mg by mouth at bedtime.     ezetimibe  (ZETIA ) 10 MG tablet Take 1 tablet (10 mg total) by mouth daily. (Patient taking differently: Take 10 mg by mouth at bedtime.) 90 tablet 3   oxyCODONE -acetaminophen  (PERCOCET) 5-325 MG tablet Take 1 tablet by mouth every 6 (six) hours as needed for severe pain (pain score 7-10). 8 tablet 0   rosuvastatin  (CRESTOR ) 40 MG tablet Take 1 tablet (40 mg total) by mouth daily. (Patient taking differently: Take 40 mg by mouth at bedtime.) 90 tablet 3   No current facility-administered medications for this visit.     ROS:  See HPI  Physical Exam:  Incision: Right sided neck incision well-healed without signs of infection Extremities: Palpable radial pulses bilaterally Neuro: Intact motor and strength of bilateral upper and lower extremities, no slurred speech, alert and oriented x 3    Assessment/Plan:  This is a 73 y.o. male who is here for postop visit  - The patient recently underwent right carotid endarterectomy with bovine pericardial patch angioplasty for asymptomatic high-grade stenosis - His right sided neck incision is well-healed without signs of infection or hematoma -He denies  any strokelike symptoms since surgery.  He remains neurologically intact on exam - He is tolerating a normal diet without swallowing difficulty.  He is slowly increasing his level of activity since surgery -He can continue his daily aspirin  and statin.  He will follow-up with our office in 3 months with carotid duplex   Ahmed Holster, PA-C [facility] [phone]   Clinic MD:  Lanis

## 2024-09-28 ENCOUNTER — Other Ambulatory Visit: Payer: Self-pay | Admitting: *Deleted

## 2024-09-28 DIAGNOSIS — I6523 Occlusion and stenosis of bilateral carotid arteries: Secondary | ICD-10-CM

## 2024-11-12 ENCOUNTER — Ambulatory Visit: Admitting: Cardiovascular Disease

## 2024-12-26 ENCOUNTER — Other Ambulatory Visit: Payer: Self-pay | Admitting: Cardiovascular Disease

## 2024-12-26 DIAGNOSIS — I25118 Atherosclerotic heart disease of native coronary artery with other forms of angina pectoris: Secondary | ICD-10-CM

## 2024-12-26 DIAGNOSIS — Z79899 Other long term (current) drug therapy: Secondary | ICD-10-CM

## 2024-12-26 DIAGNOSIS — E7800 Pure hypercholesterolemia, unspecified: Secondary | ICD-10-CM

## 2024-12-26 DIAGNOSIS — E782 Mixed hyperlipidemia: Secondary | ICD-10-CM

## 2024-12-26 DIAGNOSIS — I779 Disorder of arteries and arterioles, unspecified: Secondary | ICD-10-CM

## 2024-12-26 DIAGNOSIS — Z951 Presence of aortocoronary bypass graft: Secondary | ICD-10-CM

## 2025-01-10 ENCOUNTER — Ambulatory Visit (HOSPITAL_COMMUNITY)
Admission: RE | Admit: 2025-01-10 | Discharge: 2025-01-10 | Disposition: A | Discharge status: Home / Self Care | Source: Ambulatory Visit | Attending: Vascular Surgery | Admitting: Vascular Surgery

## 2025-01-10 ENCOUNTER — Ambulatory Visit: Admitting: Physician Assistant

## 2025-01-10 VITALS — BP 147/82 | HR 53 | Temp 98.4°F | Resp 16 | Ht 67.0 in | Wt 178.0 lb

## 2025-01-10 DIAGNOSIS — I6523 Occlusion and stenosis of bilateral carotid arteries: Secondary | ICD-10-CM | POA: Diagnosis not present

## 2025-01-10 NOTE — Progress Notes (Signed)
 "   Office Note   History of Present Illness   Scott Marshall is a 74 y.o. (Feb 25, 1951) male who presents for follow-up for carotid artery stenosis.  He recently underwent right carotid endarterectomy with bovine pericardial patch angioplasty by Dr. Lanis on 08/27/2024 for asymptomatic high-grade right ICA stenosis.  His postoperative course was uncomplicated.  He returns today for follow-up.  He has no complaints at today's office visit.  He denies any recent strokelike symptoms such as slurred speech, facial droop, sudden visual changes, or sudden weakness/numbness.  He tries to stay active by biking and working out throughout the week.  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  He takes an aspirin  and statin daily.  Current Outpatient Medications  Medication Sig Dispense Refill   aspirin  EC 81 MG tablet Take 81 mg by mouth at bedtime.     ezetimibe  (ZETIA ) 10 MG tablet Take 1 tablet (10 mg total) by mouth daily. (Patient taking differently: Take 10 mg by mouth at bedtime.) 90 tablet 3   oxyCODONE -acetaminophen  (PERCOCET) 5-325 MG tablet Take 1 tablet by mouth every 6 (six) hours as needed for severe pain (pain score 7-10). 8 tablet 0   rosuvastatin  (CRESTOR ) 40 MG tablet TAKE 1 TABLET BY MOUTH EVERY DAY 90 tablet 3   No current facility-administered medications for this visit.    REVIEW OF SYSTEMS (negative unless checked):   Cardiac:  []  Chest pain or chest pressure? []  Shortness of breath upon activity? []  Shortness of breath when lying flat? []  Irregular heart rhythm?  Vascular:  []  Pain in calf, thigh, or hip brought on by walking? []  Pain in feet at night that wakes you up from your sleep? []  Blood clot in your veins? []  Leg swelling?  Pulmonary:  []  Oxygen at home? []  Productive cough? []  Wheezing?  Neurologic:  []  Sudden weakness in arms or legs? []  Sudden numbness in arms or legs? []  Sudden onset of difficult speaking or slurred speech? []  Temporary loss of  vision in one eye? []  Problems with dizziness?  Gastrointestinal:  []  Blood in stool? []  Vomited blood?  Genitourinary:  []  Burning when urinating? []  Blood in urine?  Psychiatric:  []  Major depression  Hematologic:  []  Bleeding problems? []  Problems with blood clotting?  Dermatologic:  []  Rashes or ulcers?  Constitutional:  []  Fever or chills?  Ear/Nose/Throat:  []  Change in hearing? []  Nose bleeds? []  Sore throat?  Musculoskeletal:  []  Back pain? []  Joint pain? []  Muscle pain?   Physical Examination   Vitals:   01/10/25 1221 01/10/25 1223  BP: (!) 141/77 (!) 147/82  Pulse: (!) 53   Resp: 16   Temp: 98.4 F (36.9 C)   TempSrc: Temporal   SpO2: 97%   Weight: 178 lb (80.7 kg)   Height: 5' 7 (1.702 m)    There is no height or weight on file to calculate BMI.  General:  WDWN in NAD; vital signs documented above Gait: Not observed HENT: WNL, normocephalic Pulmonary: normal non-labored breathing  Cardiac: regular Abdomen: soft, NT, no masses Skin: without rashes Vascular Exam/Pulses: palpable radial pulses bilaterally Extremities: without ischemic changes, without gangrene , without cellulitis; without open wounds;  Musculoskeletal: no muscle wasting or atrophy  Neurologic: A&O X 3;  No focal weakness or paresthesias are detected Psychiatric:  The pt has normal affect  Non-Invasive Vascular Imaging   Bilateral Carotid Duplex (01/10/2025):  R ICA stenosis:  Patent carotid endarterectomy site without restenosis R  VA:  patent and antegrade L ICA stenosis:  40-59% L VA:  patent and antegrade   Medical Decision Making   Scott Marshall is a 74 y.o. male who presents for surveillance of carotid artery stenosis  Based on the patient's vascular studies, his right carotid endarterectomy site is patent without restenosis.  His left ICA stenosis is stable and borderline between 1 to 39% and 40 to 59% He denies any strokelike symptoms such as slurred speech,  facial droop, sudden visual changes, or sudden weakness/numbness.  He has no neurological deficits on exam.  He has palpable and equal radial pulses bilaterally He will continue his daily aspirin  and statin and follow-up with our office in 9 months with repeat carotid duplex   Scott Holster PA-C [facility] [place] Office: [phone]  Call FI:Rjpw "

## 2025-01-11 ENCOUNTER — Other Ambulatory Visit: Payer: Self-pay

## 2025-01-11 DIAGNOSIS — I6523 Occlusion and stenosis of bilateral carotid arteries: Secondary | ICD-10-CM

## 2025-02-14 ENCOUNTER — Ambulatory Visit: Admitting: Cardiovascular Disease

## 2025-10-10 ENCOUNTER — Ambulatory Visit

## 2025-10-10 ENCOUNTER — Ambulatory Visit (HOSPITAL_COMMUNITY)
